# Patient Record
Sex: Male | Born: 1985 | Race: White | Hispanic: No | Marital: Single | Smoking: Current every day smoker
Health system: Southern US, Community
[De-identification: ages and names within clinical notes are randomized; demographics above are authoritative.]

## PROBLEM LIST (undated history)

## (undated) DIAGNOSIS — F988 Other specified behavioral and emotional disorders with onset usually occurring in childhood and adolescence: Secondary | ICD-10-CM

## (undated) DIAGNOSIS — J45909 Unspecified asthma, uncomplicated: Secondary | ICD-10-CM

## (undated) DIAGNOSIS — M419 Scoliosis, unspecified: Secondary | ICD-10-CM

## (undated) HISTORY — PX: MOLE REMOVAL: SHX2046

---

## 2005-06-19 ENCOUNTER — Emergency Department (HOSPITAL_COMMUNITY): Admission: EM | Admit: 2005-06-19 | Discharge: 2005-06-19 | Payer: Self-pay | Admitting: Emergency Medicine

## 2007-11-20 ENCOUNTER — Ambulatory Visit (HOSPITAL_COMMUNITY): Admission: RE | Admit: 2007-11-20 | Discharge: 2007-11-20 | Payer: Self-pay | Admitting: Family Medicine

## 2011-05-23 ENCOUNTER — Encounter: Payer: Self-pay | Admitting: *Deleted

## 2011-05-23 ENCOUNTER — Emergency Department (HOSPITAL_COMMUNITY): Payer: No Typology Code available for payment source

## 2011-05-23 ENCOUNTER — Emergency Department (HOSPITAL_COMMUNITY)
Admission: EM | Admit: 2011-05-23 | Discharge: 2011-05-23 | Disposition: A | Discharge status: Home / Self Care | Payer: No Typology Code available for payment source | Attending: Emergency Medicine | Admitting: Emergency Medicine

## 2011-05-23 DIAGNOSIS — F17200 Nicotine dependence, unspecified, uncomplicated: Secondary | ICD-10-CM | POA: Insufficient documentation

## 2011-05-23 DIAGNOSIS — M542 Cervicalgia: Secondary | ICD-10-CM | POA: Insufficient documentation

## 2011-05-23 DIAGNOSIS — S161XXA Strain of muscle, fascia and tendon at neck level, initial encounter: Secondary | ICD-10-CM

## 2011-05-23 DIAGNOSIS — S139XXA Sprain of joints and ligaments of unspecified parts of neck, initial encounter: Secondary | ICD-10-CM | POA: Insufficient documentation

## 2011-05-23 MED ORDER — CYCLOBENZAPRINE HCL 10 MG PO TABS: ORAL_TABLET | ORAL | Status: DC

## 2011-05-23 MED ORDER — IBUPROFEN 800 MG PO TABS
800.0000 mg | ORAL_TABLET | Freq: Once | ORAL | Status: AC
  Administered 2011-05-23: 800 mg via ORAL
  Filled 2011-05-23: qty 1
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
## 2011-05-24 NOTE — ED Provider Notes (Signed)
Medical screening examination/treatment/procedure(s) were performed by non-physician practitioner and as supervising physician I was immediately available for consultation/collaboration. Ichiro Chesnut, MD, FACEP   Casmere Hollenbeck L Channa Hazelett, MD 05/24/11 0107 

## 2011-12-27 DIAGNOSIS — Z79899 Other long term (current) drug therapy: Secondary | ICD-10-CM | POA: Diagnosis not present

## 2011-12-27 DIAGNOSIS — J45909 Unspecified asthma, uncomplicated: Secondary | ICD-10-CM | POA: Diagnosis not present

## 2011-12-27 DIAGNOSIS — B354 Tinea corporis: Secondary | ICD-10-CM | POA: Diagnosis not present

## 2012-01-31 DIAGNOSIS — Z6828 Body mass index (BMI) 28.0-28.9, adult: Secondary | ICD-10-CM | POA: Diagnosis not present

## 2012-01-31 DIAGNOSIS — S93409A Sprain of unspecified ligament of unspecified ankle, initial encounter: Secondary | ICD-10-CM | POA: Diagnosis not present

## 2012-01-31 DIAGNOSIS — M25579 Pain in unspecified ankle and joints of unspecified foot: Secondary | ICD-10-CM | POA: Diagnosis not present

## 2012-03-05 DIAGNOSIS — Z6828 Body mass index (BMI) 28.0-28.9, adult: Secondary | ICD-10-CM | POA: Diagnosis not present

## 2012-03-05 DIAGNOSIS — Z7182 Exercise counseling: Secondary | ICD-10-CM | POA: Diagnosis not present

## 2012-03-05 DIAGNOSIS — Z713 Dietary counseling and surveillance: Secondary | ICD-10-CM | POA: Diagnosis not present

## 2012-03-05 DIAGNOSIS — K219 Gastro-esophageal reflux disease without esophagitis: Secondary | ICD-10-CM | POA: Diagnosis not present

## 2012-11-07 DIAGNOSIS — J45909 Unspecified asthma, uncomplicated: Secondary | ICD-10-CM | POA: Diagnosis not present

## 2012-11-07 DIAGNOSIS — Z79899 Other long term (current) drug therapy: Secondary | ICD-10-CM | POA: Diagnosis not present

## 2012-11-07 DIAGNOSIS — J984 Other disorders of lung: Secondary | ICD-10-CM | POA: Diagnosis not present

## 2012-11-07 DIAGNOSIS — Z6827 Body mass index (BMI) 27.0-27.9, adult: Secondary | ICD-10-CM | POA: Diagnosis not present

## 2012-11-07 DIAGNOSIS — Z0000 Encounter for general adult medical examination without abnormal findings: Secondary | ICD-10-CM | POA: Diagnosis not present

## 2012-11-28 DIAGNOSIS — F988 Other specified behavioral and emotional disorders with onset usually occurring in childhood and adolescence: Secondary | ICD-10-CM | POA: Diagnosis not present

## 2012-12-31 DIAGNOSIS — F988 Other specified behavioral and emotional disorders with onset usually occurring in childhood and adolescence: Secondary | ICD-10-CM | POA: Diagnosis not present

## 2013-01-08 DIAGNOSIS — F988 Other specified behavioral and emotional disorders with onset usually occurring in childhood and adolescence: Secondary | ICD-10-CM | POA: Diagnosis not present

## 2013-01-27 DIAGNOSIS — J45909 Unspecified asthma, uncomplicated: Secondary | ICD-10-CM | POA: Diagnosis not present

## 2013-01-27 DIAGNOSIS — IMO0002 Reserved for concepts with insufficient information to code with codable children: Secondary | ICD-10-CM | POA: Diagnosis not present

## 2013-01-27 DIAGNOSIS — Z6827 Body mass index (BMI) 27.0-27.9, adult: Secondary | ICD-10-CM | POA: Diagnosis not present

## 2013-02-04 DIAGNOSIS — F988 Other specified behavioral and emotional disorders with onset usually occurring in childhood and adolescence: Secondary | ICD-10-CM | POA: Diagnosis not present

## 2013-04-24 DIAGNOSIS — F988 Other specified behavioral and emotional disorders with onset usually occurring in childhood and adolescence: Secondary | ICD-10-CM | POA: Diagnosis not present

## 2013-05-20 DIAGNOSIS — Z681 Body mass index (BMI) 19 or less, adult: Secondary | ICD-10-CM | POA: Diagnosis not present

## 2013-05-20 DIAGNOSIS — L0291 Cutaneous abscess, unspecified: Secondary | ICD-10-CM | POA: Diagnosis not present

## 2013-09-24 DIAGNOSIS — J209 Acute bronchitis, unspecified: Secondary | ICD-10-CM | POA: Diagnosis not present

## 2013-09-24 DIAGNOSIS — Z6828 Body mass index (BMI) 28.0-28.9, adult: Secondary | ICD-10-CM | POA: Diagnosis not present

## 2013-10-13 DIAGNOSIS — Z681 Body mass index (BMI) 19 or less, adult: Secondary | ICD-10-CM | POA: Diagnosis not present

## 2013-10-13 DIAGNOSIS — L0291 Cutaneous abscess, unspecified: Secondary | ICD-10-CM | POA: Diagnosis not present

## 2013-10-13 DIAGNOSIS — L0390 Cellulitis, unspecified: Secondary | ICD-10-CM | POA: Diagnosis not present

## 2013-10-23 DIAGNOSIS — F988 Other specified behavioral and emotional disorders with onset usually occurring in childhood and adolescence: Secondary | ICD-10-CM | POA: Diagnosis not present

## 2014-01-22 DIAGNOSIS — F988 Other specified behavioral and emotional disorders with onset usually occurring in childhood and adolescence: Secondary | ICD-10-CM | POA: Diagnosis not present

## 2014-04-16 DIAGNOSIS — F988 Other specified behavioral and emotional disorders with onset usually occurring in childhood and adolescence: Secondary | ICD-10-CM | POA: Diagnosis not present

## 2014-07-09 DIAGNOSIS — F909 Attention-deficit hyperactivity disorder, unspecified type: Secondary | ICD-10-CM | POA: Diagnosis not present

## 2014-09-28 DIAGNOSIS — F900 Attention-deficit hyperactivity disorder, predominantly inattentive type: Secondary | ICD-10-CM | POA: Diagnosis not present

## 2015-01-27 DIAGNOSIS — F900 Attention-deficit hyperactivity disorder, predominantly inattentive type: Secondary | ICD-10-CM | POA: Diagnosis not present

## 2015-02-01 DIAGNOSIS — F900 Attention-deficit hyperactivity disorder, predominantly inattentive type: Secondary | ICD-10-CM | POA: Diagnosis not present

## 2015-03-22 DIAGNOSIS — D485 Neoplasm of uncertain behavior of skin: Secondary | ICD-10-CM | POA: Diagnosis not present

## 2015-03-22 DIAGNOSIS — Z1389 Encounter for screening for other disorder: Secondary | ICD-10-CM | POA: Diagnosis not present

## 2015-03-22 DIAGNOSIS — E663 Overweight: Secondary | ICD-10-CM | POA: Diagnosis not present

## 2015-04-28 DIAGNOSIS — Z719 Counseling, unspecified: Secondary | ICD-10-CM | POA: Diagnosis not present

## 2015-04-28 DIAGNOSIS — Z6829 Body mass index (BMI) 29.0-29.9, adult: Secondary | ICD-10-CM | POA: Diagnosis not present

## 2015-04-28 DIAGNOSIS — F70 Mild intellectual disabilities: Secondary | ICD-10-CM | POA: Diagnosis not present

## 2015-04-28 DIAGNOSIS — Z1389 Encounter for screening for other disorder: Secondary | ICD-10-CM | POA: Diagnosis not present

## 2015-04-28 DIAGNOSIS — E663 Overweight: Secondary | ICD-10-CM | POA: Diagnosis not present

## 2015-04-28 DIAGNOSIS — Z0001 Encounter for general adult medical examination with abnormal findings: Secondary | ICD-10-CM | POA: Diagnosis not present

## 2015-04-28 DIAGNOSIS — F909 Attention-deficit hyperactivity disorder, unspecified type: Secondary | ICD-10-CM | POA: Diagnosis not present

## 2015-05-11 DIAGNOSIS — L812 Freckles: Secondary | ICD-10-CM | POA: Diagnosis not present

## 2015-05-11 DIAGNOSIS — L218 Other seborrheic dermatitis: Secondary | ICD-10-CM | POA: Diagnosis not present

## 2015-05-11 DIAGNOSIS — C44310 Basal cell carcinoma of skin of unspecified parts of face: Secondary | ICD-10-CM | POA: Diagnosis not present

## 2015-05-11 DIAGNOSIS — D1801 Hemangioma of skin and subcutaneous tissue: Secondary | ICD-10-CM | POA: Diagnosis not present

## 2015-06-22 DIAGNOSIS — C44319 Basal cell carcinoma of skin of other parts of face: Secondary | ICD-10-CM | POA: Diagnosis not present

## 2015-06-28 DIAGNOSIS — Z0001 Encounter for general adult medical examination with abnormal findings: Secondary | ICD-10-CM | POA: Diagnosis not present

## 2015-06-28 DIAGNOSIS — Z1389 Encounter for screening for other disorder: Secondary | ICD-10-CM | POA: Diagnosis not present

## 2015-06-28 DIAGNOSIS — F70 Mild intellectual disabilities: Secondary | ICD-10-CM | POA: Diagnosis not present

## 2015-06-28 DIAGNOSIS — E785 Hyperlipidemia, unspecified: Secondary | ICD-10-CM | POA: Diagnosis not present

## 2015-08-27 ENCOUNTER — Encounter (HOSPITAL_COMMUNITY): Payer: Medicare Other | Attending: Hematology & Oncology | Admitting: Hematology & Oncology

## 2015-08-27 ENCOUNTER — Encounter (HOSPITAL_COMMUNITY): Payer: Self-pay | Admitting: Hematology & Oncology

## 2015-08-27 VITALS — BP 166/91 | HR 54 | Temp 97.7°F | Resp 18 | Ht 68.0 in | Wt 195.7 lb

## 2015-08-27 DIAGNOSIS — Z720 Tobacco use: Secondary | ICD-10-CM | POA: Diagnosis not present

## 2015-08-27 DIAGNOSIS — J4520 Mild intermittent asthma, uncomplicated: Secondary | ICD-10-CM

## 2015-08-27 DIAGNOSIS — D72829 Elevated white blood cell count, unspecified: Secondary | ICD-10-CM | POA: Diagnosis not present

## 2015-08-27 DIAGNOSIS — J45909 Unspecified asthma, uncomplicated: Secondary | ICD-10-CM | POA: Insufficient documentation

## 2015-08-27 DIAGNOSIS — D729 Disorder of white blood cells, unspecified: Secondary | ICD-10-CM | POA: Diagnosis not present

## 2015-08-27 LAB — COMPREHENSIVE METABOLIC PANEL
ALT: 46 U/L (ref 17–63)
AST: 29 U/L (ref 15–41)
Albumin: 4.4 g/dL (ref 3.5–5.0)
Alkaline Phosphatase: 59 U/L (ref 38–126)
Anion gap: 9 (ref 5–15)
BUN: 12 mg/dL (ref 6–20)
CO2: 26 mmol/L (ref 22–32)
Calcium: 8.6 mg/dL — ABNORMAL LOW (ref 8.9–10.3)
Chloride: 105 mmol/L (ref 101–111)
Creatinine, Ser: 0.85 mg/dL (ref 0.61–1.24)
GFR calc Af Amer: >60 mL/min (ref >60)
GFR calc non Af Amer: >60 mL/min (ref >60)
GLUCOSE: 102 mg/dL — AB (ref 65–99)
POTASSIUM: 4.2 mmol/L (ref 3.5–5.1)
SODIUM: 140 mmol/L (ref 135–145)
Total Bilirubin: 0.5 mg/dL (ref 0.3–1.2)
Total Protein: 7.4 g/dL (ref 6.5–8.1)

## 2015-08-27 LAB — TSH: TSH: 1.982 u[IU]/mL (ref 0.350–4.500)

## 2015-08-27 LAB — CBC WITH DIFFERENTIAL/PLATELET
Basophils Absolute: 0.1 10*3/uL (ref 0.0–0.1)
Basophils Relative: 0 %
Eosinophils Absolute: 0.2 10*3/uL (ref 0.0–0.7)
Eosinophils Relative: 2 %
HCT: 46.1 % (ref 39.0–52.0)
Hemoglobin: 15.3 g/dL (ref 13.0–17.0)
Lymphocytes Relative: 31 %
Lymphs Abs: 4.2 10*3/uL — ABNORMAL HIGH (ref 0.7–4.0)
MCH: 28.6 pg (ref 26.0–34.0)
MCHC: 33.2 g/dL (ref 30.0–36.0)
MCV: 86.2 fL (ref 78.0–100.0)
Monocytes Absolute: 1.3 10*3/uL — ABNORMAL HIGH (ref 0.1–1.0)
Monocytes Relative: 10 %
NEUTROS ABS: 7.9 10*3/uL — AB (ref 1.7–7.7)
Neutrophils Relative %: 57 %
Platelets: 271 10*3/uL (ref 150–400)
RBC: 5.35 MIL/uL (ref 4.22–5.81)
RDW: 14.1 % (ref 11.5–15.5)
WBC: 13.6 10*3/uL — ABNORMAL HIGH (ref 4.0–10.5)

## 2015-08-27 LAB — C-REACTIVE PROTEIN: CRP: 0.6 mg/dL (ref <1.0)

## 2015-08-27 LAB — SEDIMENTATION RATE: Sed Rate: 3 mm/hr (ref 0–16)

## 2015-08-27 NOTE — Progress Notes (Signed)
Bruce Nichols's reason for visit today is for labs as scheduled per MD orders.  Venipuncture performed with a 23 gauge butterfly needle to L Antecubital.  Bruce Nichols tolerated procedure well and without incident; questions were answered and patient was discharged.

## 2015-08-27 NOTE — Progress Notes (Signed)
[place] at [place] NOTE  No care team member to display  CHIEF COMPLAINTS/PURPOSE OF CONSULTATION:  Leukocytosis  HISTORY OF PRESENTING ILLNESS: Bruce Nichols 30 y.o. male is here because of leukocytosis. He is here today with his mother. They've been going to [facility] for a while now.  Bruce Nichols says he feels pretty good most of the time, and that his appetite is well; he says he eats well. He says he doesn't sleep all that well, "but when he sleeps, he sleeps."  His mother states that he does have a tendency to get bronchitis. She says that he has an inhaler for chronic asthma, but she thinks that the bronchitis is more noteworthy. She says that she thinks that the asthma started in his teens. She notes that he smokes and it certainly does not help.   Bruce Nichols mother says that she doesn't know if he has eczema, but that he definitely has really dry skin, and his scalp especially is very dry. His mother says that she herself struggles from eczema.  He denies seasonal allergies.   He denies fevers, chills, recurrent infections, weight loss, change in bowel or bladder habits.   MEDICAL HISTORY:  History reviewed. No pertinent past medical history.  SURGICAL HISTORY: Past Surgical History  Procedure Laterality Date  . Mole removal      right side of face    SOCIAL HISTORY: Social History   Social History  . Marital Status: Single    Spouse Name: N/A  . Number of Children: N/A  . Years of Education: N/A   Occupational History  . Not on file.   Social History Main Topics  . Smoking status: Current Every Day Smoker -- 2.00 packs/day  . Smokeless tobacco: Not on file  . Alcohol Use: Yes     Comment: occasionally  . Drug Use: No  . Sexual Activity: Not on file   Other Topics Concern  . 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  He lives at home with his mom; his parents are divorced. He smokes; his mother says "he's really not bad;" she says a pack will last him 3-4 days She says he was in his late 20's before he started smoking, and she feels he only started because of social reasons. He says he occasionally drinks alcohol; his mother says about once a month He says he can't lie about it, he occasionally uses marijuana He is ADD and takes generic adderol  FAMILY HISTORY: History reviewed. No pertinent family history. has no family status information on file.    His mother is 25, but will be 59 on the 21st.  She has high blood pressure, but that's her only health concern she reports They are both heavier His father is around 3 and has had heart surgery, and his lungs have collapsed on him before His father is a chain smoker  He has one half sister; she is 69 now he thinks  ALLERGIES:  is allergic to bee venom and penicillins.  MEDICATIONS:  Current Outpatient Prescriptions  Medication Sig Dispense Refill  . amphetamine-dextroamphetamine (ADDERALL) 20 MG tablet Take 20 mg by mouth daily.     No current facility-administered medications for this visit.    Review of Systems  Constitutional: Negative.   HENT: Negative.   Eyes:  Negative.   Respiratory: Negative.   Cardiovascular: Negative.   Gastrointestinal: Negative.   Genitourinary: Negative.   Musculoskeletal: Negative.   Skin: Negative.   Neurological: Negative.   Endo/Heme/Allergies: Negative.   Psychiatric/Behavioral: Negative.   All other systems reviewed and are negative.  14 point ROS was done and is otherwise as detailed above or in HPI   PHYSICAL EXAMINATION: ECOG PERFORMANCE STATUS: 0 - Asymptomatic  Filed Vitals:   08/27/15 1503  BP: 166/91  Pulse: 54  Temp: 97.7 F (36.5 C)  Resp: 18   Filed Weights   08/27/15 1503  Weight: 195 lb 11.2 oz (88.769 kg)   Physical Exam  Constitutional: He is  oriented to person, place, and time and well-developed, well-nourished, and in no distress.  Overweight.  HENT:  Head: Normocephalic and atraumatic.  Nose: Nose normal.  Mouth/Throat: Oropharynx is clear and moist. No oropharyngeal exudate.  Eyes: Conjunctivae and EOM are normal. Pupils are equal, round, and reactive to light. Right eye exhibits no discharge. Left eye exhibits no discharge. No scleral icterus.  Neck: Normal range of motion. Neck supple. No tracheal deviation present. No thyromegaly present.  Cardiovascular: Normal rate, regular rhythm and normal heart sounds.  Exam reveals no gallop and no friction rub.   No murmur heard. Pulmonary/Chest: Effort normal and breath sounds normal. He has no wheezes. He has no rales.  Abdominal: Soft. Bowel sounds are normal. He exhibits no distension and no mass. There is no tenderness. There is no rebound and no guarding.  Musculoskeletal: Normal range of motion. He exhibits no edema.  Lymphadenopathy:    He has no cervical adenopathy.  Neurological: He is alert and oriented to person, place, and time. He has normal reflexes. No cranial nerve deficit. Gait normal. Coordination normal.  Skin: Skin is warm and dry. No rash noted.  Psychiatric: Mood, memory, affect and judgment normal.  Nursing note and vitals reviewed.   LABORATORY DATA:  I have reviewed the data as listed Laboratory studies from Dr. Nolon Rod office show a WBC count of 10.9K on 11/07/2012 with a neutrophil predominance. WBC count of 14K on 06/27/2013 on 06/28/2015 with an elevation of neutrophils, lymphocytes and monocytes.   ASSESSMENT & PLAN:  Leukocytosis Tobacco Use   I reviewed the potential causes of leukocytosis (specifically mild neutrophilia) including but not limited to:  ?Any active inflammatory condition or infection ?Cigarette smoking, which may be the most common cause of mild neutrophilia ?Previously diagnosed hematologic disease (such as acute and chronic  leukemias, chronic myeloproliferative or myelodysplastic disease) ?The presence of, and treatment for, a chronic anxiety state, panic disorder, rage, or emotional stress (eg, posttraumatic stress disorder, depression) ?Presence of non-hematologic diseases known to increase neutrophil counts (eg, eclampsia, thyroid storm, hypercortisolism). Medications - Various medications may cause neutrophilia. However,  such cases are rare and appear in the literature as isolated case reports.  Plan today is to proceed with a CBC with peripheral smear review, BCR-ABL to r/o CML, CRP and ESR to look for occult inflammatory disease and TSH. MPD evaluation will not be pursued today however will certainly be an option moving forward if necessary.  We will see him back in 3 weeks for follow-up on his labs, to assess what's causing his leukocytosis. We will discuss smoking cessation further at that time.  Orders Placed This Encounter  Procedures  . CBC with Differential  . Pathologist smear review  . Comprehensive metabolic panel  . BCR-ABL1, CML/ALL, PCR, QUANT  . Sedimentation rate  .  C-reactive protein  . TSH    All questions were answered. The patient knows to call the clinic with any problems, questions or concerns.  This document serves as a record of services personally performed by Ancil Linsey, MD. It was created on her behalf by Toni Amend, a trained medical scribe. The creation of this record is based on the scribe's personal observations and the provider's statements to them. This document has been checked and approved by the attending provider.  I have reviewed the above documentation for accuracy and completeness, and I agree with the above.  This note was electronically signed.    Molli Hazard, MD  08/27/2015 3:43 PM

## 2015-08-27 NOTE — Patient Instructions (Addendum)
[place] at [facility] Discharge Instructions  RECOMMENDATIONS MADE BY THE CONSULTANT AND ANY TEST RESULTS WILL BE SENT TO YOUR REFERRING PHYSICIAN.  Return in 3 weeks to discuss the results of your lab work.   We will draw labs today.     Thank you for choosing [place] at [facility] to provide your oncology and hematology care.  To afford each patient quality time with our provider, please arrive at least 15 minutes before your scheduled appointment time.    You need to re-schedule your appointment should you arrive 10 or more minutes late.  We strive to give you quality time with our providers, and arriving late affects you and other patients whose appointments are after yours.  Also, if you no show three or more times for appointments you may be dismissed from the clinic at the providers discretion.     Again, thank you for choosing [facility].  Our hope is that these requests will decrease the amount of time that you wait before being seen by our physicians.       _____________________________________________________________  Should you have questions after your visit to [facility], please contact our office at [phone] between the hours of 8:30 a.m. and 4:30 p.m.  Voicemails left after 4:30 p.m. will not be returned until the following business day.  For prescription refill requests, have your pharmacy contact our office.

## 2015-08-30 LAB — PATHOLOGIST SMEAR REVIEW

## 2015-09-03 LAB — BCR-ABL1, CML/ALL, PCR, QUANT

## 2015-09-16 ENCOUNTER — Encounter (HOSPITAL_COMMUNITY): Payer: Self-pay | Admitting: Hematology & Oncology

## 2015-09-16 ENCOUNTER — Encounter (HOSPITAL_BASED_OUTPATIENT_CLINIC_OR_DEPARTMENT_OTHER): Payer: Medicare Other | Admitting: Hematology & Oncology

## 2015-09-16 ENCOUNTER — Ambulatory Visit (HOSPITAL_COMMUNITY): Payer: Self-pay | Admitting: Hematology & Oncology

## 2015-09-16 VITALS — BP 130/80 | HR 46 | Temp 97.6°F | Resp 16 | Wt 196.4 lb

## 2015-09-16 DIAGNOSIS — Z720 Tobacco use: Secondary | ICD-10-CM

## 2015-09-16 DIAGNOSIS — D72829 Elevated white blood cell count, unspecified: Secondary | ICD-10-CM | POA: Diagnosis not present

## 2015-09-16 NOTE — Progress Notes (Deleted)
[name] at [place] NOTE  No care team member to display  CHIEF COMPLAINTS/PURPOSE OF CONSULTATION:  Leukocytosis  HISTORY OF PRESENTING ILLNESS:   ***  Bruce Nichols 30 y.o. male is here because of leukocytosis. He is here today with his mother. They've been going to [facility] for a while now.  [name] says he feels pretty good most of the time, and that his appetite is well; he says he eats well. He says he doesn't sleep all that well, "but when he sleeps, he sleeps."  His mother states that he does have a tendency to get bronchitis. She says that he has an inhaler for chronic asthma, but she thinks that the bronchitis is more noteworthy. She says that she thinks that the asthma started in his teens.  [name] mother says that she doesn't know if he has eczema, but that he definitely has really dry skin, and his scalp especially is very dry. His mother says that she herself struggles from eczema.  His mother doesn't know of any allergies that he has; she says she hasn't seen anything stir up other than the bronchitis.  During the physical exam, he denies any belly pain and denies any pain when his abdomen is palpated.    No history exists.     MEDICAL HISTORY:  History reviewed. No pertinent past medical history.  SURGICAL HISTORY: Past Surgical History  Procedure Laterality Date  . Mole removal      right side of face    SOCIAL HISTORY: Social History   Social History  . Marital Status: Single    Spouse Name: N/A  . Number of Children: N/A  . Years of Education: N/A   Occupational History  . Not on file.   Social History Main Topics  . Smoking status: Current Every Day Smoker -- 2.00 packs/day  . Smokeless tobacco: Not on file  . Alcohol Use: Yes     Comment: occasionally  . Drug Use: No  . Sexual Activity: Not on file   Other Topics Concern  . 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  He lives at home with his mom; his parents are divorced. He smokes; his mother says "he's really not bad;" she says a pack will last him 3-4 days She says he was in his late 20's before he started smoking, and she feels he only started because of social reasons. He says he occasionally drinks alcohol; his mother says about once a month He says he can't lie about it, he occasionally uses marijuana He is ADD and takes generic adderol  FAMILY HISTORY: History reviewed. No pertinent family history. has no family status information on file.    His mother is 38, but will be 35 on the 21st.  She has high blood pressure, but that's her only health concern she reports They are both heavier His father is around 59 and has had heart surgery, and his lungs have collapsed on him before His father is a chain smoker  He has one half sister; she is 60 now he thinks  ALLERGIES:  is allergic to bee venom and penicillins.  MEDICATIONS:  Current Outpatient Prescriptions  Medication Sig Dispense Refill  . albuterol (PROVENTIL HFA;VENTOLIN HFA) 108 (90 Base) MCG/ACT inhaler Inhale 1 puff into the lungs every 6 (  six) hours as needed for wheezing or shortness of breath.    . amphetamine-dextroamphetamine (ADDERALL) 20 MG tablet Take 20 mg by mouth daily.     No current facility-administered medications for this visit.    Review of Systems  Constitutional: Negative.   HENT: Negative.   Eyes: Negative.   Respiratory: Negative.   Cardiovascular: Negative.   Gastrointestinal: Negative.   Genitourinary: Negative.   Musculoskeletal: Negative.   Skin: Negative.   Neurological: Negative.   Endo/Heme/Allergies: Negative.   Psychiatric/Behavioral: Negative.   All other systems reviewed and are negative.  14 point ROS was done and is otherwise as detailed above or in HPI  ***  PHYSICAL  EXAMINATION: ECOG PERFORMANCE STATUS: {CHL ONC ECOG [id number]  Filed Vitals:   09/16/15 1035  BP: 130/80  Pulse: 46  Temp: 97.6 F (36.4 C)  Resp: 16   Filed Weights   09/16/15 1035  Weight: 196 lb 6.4 oz (89.086 kg)     Physical Exam  Constitutional: He is oriented to person, place, and time and well-developed, well-nourished, and in no distress.  Overweight.  HENT:  Head: Normocephalic and atraumatic.  Mouth/Throat: Oropharynx is clear and moist.  Eyes: Conjunctivae and EOM are normal. Pupils are equal, round, and reactive to light.  Neck: Normal range of motion. Neck supple.  Cardiovascular: Normal rate, regular rhythm and normal heart sounds.   Pulmonary/Chest: Effort normal and breath sounds normal.  Abdominal: Soft.  Musculoskeletal: Normal range of motion.  Neurological: He is alert and oriented to person, place, and time. Gait normal.  Skin: Skin is warm and dry.  Psychiatric: Mood, memory, affect and judgment normal.  Nursing note and vitals reviewed.     LABORATORY DATA:  I have reviewed the data as listed Lab Results  Component Value Date   WBC 13.6* 08/27/2015   HGB 15.3 08/27/2015   HCT 46.1 08/27/2015   MCV 86.2 08/27/2015   PLT 271 08/27/2015   CMP     Component Value Date/Time   NA 140 08/27/2015 1610   K 4.2 08/27/2015 1610   CL 105 08/27/2015 1610   CO2 26 08/27/2015 1610   GLUCOSE 102* 08/27/2015 1610   BUN 12 08/27/2015 1610   CREATININE 0.85 08/27/2015 1610   CALCIUM 8.6* 08/27/2015 1610   PROT 7.4 08/27/2015 1610   ALBUMIN 4.4 08/27/2015 1610   AST 29 08/27/2015 1610   ALT 46 08/27/2015 1610   ALKPHOS 59 08/27/2015 1610   BILITOT 0.5 08/27/2015 1610   GFRNONAA >60 08/27/2015 1610   GFRAA >60 08/27/2015 1610     RADIOGRAPHIC STUDIES: I have personally reviewed the radiological images as listed and agreed with the findings in the report. No results found.  ASSESSMENT & PLAN:   ***  We will see him back in 3 weeks  for follow-up on his labs, to assess what's causing his leukocytosis. We will discuss smoking cessation further at that time.  No problem-specific assessment & plan notes found for this encounter.  No orders of the defined types were placed in this encounter.    All questions were answered. The patient knows to call the clinic with any problems, questions or concerns.  I spent {CHL ONC TIME VISIT - [id number] counseling the patient face to face. The total time spent in the appointment was {CHL ONC TIME VISIT - [id number] and more than 50% was on counseling.  This document serves as a record of services personally performed by Ancil Linsey, MD. It was created  on her behalf by Toni Amend, a trained medical scribe. The creation of this record is based on the scribe's personal observations and the provider's statements to them. This document has been checked and approved by the attending provider.  I have reviewed the above documentation for accuracy and completeness, and I agree with the above.  This note was electronically signed.    Molli Hazard, MD  09/16/2015 11:08 AM

## 2015-09-16 NOTE — Patient Instructions (Signed)
[place] at [facility] Discharge Instructions  RECOMMENDATIONS MADE BY THE CONSULTANT AND ANY TEST RESULTS WILL BE SENT TO YOUR REFERRING PHYSICIAN.    Exam and discussion completed by Dr Whitney Muse today On your blood work, it showed nothing serious, but we are going to monitor your blood work. Work on quitting smoking. Return to see the doctor in 3 months with lab work Please call the clinic if you have any questions or concerns    Thank you for choosing [place] at [facility] to provide your oncology and hematology care.  To afford each patient quality time with our provider, please arrive at least 15 minutes before your scheduled appointment time.   Beginning January 23rd 2017 lab work for the [facility] will be done in the  Main lab at [facility] on 1st floor. If you have a lab appointment with the [facility] please come in thru the  Main Entrance and check in at the main information desk  You need to re-schedule your appointment should you arrive 10 or more minutes late.  We strive to give you quality time with our providers, and arriving late affects you and other patients whose appointments are after yours.  Also, if you no show three or more times for appointments you may be dismissed from the clinic at the providers discretion.     Again, thank you for choosing [facility].  Our hope is that these requests will decrease the amount of time that you wait before being seen by our physicians.       _____________________________________________________________  Should you have questions after your visit to [facility], please contact our office at [phone] between the hours of 8:30 a.m. and 4:30 p.m.  Voicemails left after 4:30 p.m. will not be returned until the following business day.  For prescription refill requests, have your pharmacy contact our office.

## 2015-10-09 NOTE — Progress Notes (Signed)
[place] [facility] [place] NOTE  No care team member to display  CHIEF COMPLAINTS/PURPOSE OF CONSULTATION:  Leukocytosis  HISTORY OF PRESENTING ILLNESS: Bruce Nichols 30 y.o. male is here because of leukocytosis. He is here today with his mother. They've been going to [facility] for a while now.  He is here to follow up on labs obtained at his last visit. He notes no changes. He continues to smoke.   He denies fevers, chills, recurrent infections, weight loss, change in bowel or bladder habits.   MEDICAL HISTORY:  History reviewed. No pertinent past medical history.  SURGICAL HISTORY: Past Surgical History  Procedure Laterality Date  . Mole removal      right side of face    SOCIAL HISTORY: Social History   Social History  . Marital Status: Single    Spouse Name: N/A  . Number of Children: N/A  . Years of Education: N/A   Occupational History  . Not on file.   Social History Main Topics  . Smoking status: Current Every Day Smoker -- 2.00 packs/day  . Smokeless tobacco: Not on file  . Alcohol Use: Yes     Comment: occasionally  . Drug Use: No  . Sexual Activity: Not on file   Other Topics Concern  . 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  He lives at home with his mom; his parents are divorced. He smokes; his mother says "he's really not bad;" she says a pack will last him 3-4 days She says he was in his late 20's before he started smoking, and she feels he only started because of social reasons. He says he occasionally drinks alcohol; his mother says about once a month He says he can't lie about it, he occasionally uses marijuana He is ADD and takes generic adderol  FAMILY HISTORY: History reviewed. No pertinent family history. has no family status information on file.    His mother is 32, but will be 1 on  the 21st.  She has high blood pressure, but that's her only health concern she reports They are both heavier His father is around 23 and has had heart surgery, and his lungs have collapsed on him before His father is a chain smoker  He has one half sister; she is 25 now he thinks  ALLERGIES:  is allergic to bee venom and penicillins.  MEDICATIONS:  Current Outpatient Prescriptions  Medication Sig Dispense Refill  . albuterol (PROVENTIL HFA;VENTOLIN HFA) 108 (90 Base) MCG/ACT inhaler Inhale 1 puff into the lungs every 6 (six) hours as needed for wheezing or shortness of breath.    . amphetamine-dextroamphetamine (ADDERALL) 20 MG tablet Take 20 mg by mouth daily.     No current facility-administered medications for this visit.    Review of Systems  Constitutional: Negative.   HENT: Negative.   Eyes: Negative.   Respiratory: Negative.   Cardiovascular: Negative.   Gastrointestinal: Negative.   Genitourinary: Negative.   Musculoskeletal: Negative.   Skin: Negative.   Neurological: Negative.   Endo/Heme/Allergies: Negative.   Psychiatric/Behavioral: Negative.   All other systems reviewed and are negative.  14 point ROS was done and is otherwise as detailed above or in HPI   PHYSICAL EXAMINATION: ECOG PERFORMANCE STATUS: 0 - Asymptomatic  Filed Vitals:   09/16/15 1035  BP: 130/80  Pulse: 46  Temp: 97.6 F (36.4 C)  Resp: 16   Filed Weights   09/16/15 1035  Weight: 196 lb 6.4 oz (89.086 kg)   Physical Exam  Constitutional: He is oriented to person, place, and time and well-developed, well-nourished, and in no distress.  Overweight.  HENT:  Head: Normocephalic and atraumatic.  Nose: Nose normal.  Mouth/Throat: Oropharynx is clear and moist. No oropharyngeal exudate.  Eyes: Conjunctivae and EOM are normal. Pupils are equal, round, and reactive to light. Right eye exhibits no discharge. Left eye exhibits no discharge. No scleral icterus.  Neck: Normal range of  motion. Neck supple. No tracheal deviation present. No thyromegaly present.  Cardiovascular: Normal rate, regular rhythm and normal heart sounds.  Exam reveals no gallop and no friction rub.   No murmur heard. Pulmonary/Chest: Effort normal and breath sounds normal. He has no wheezes. He has no rales.  Abdominal: Soft. Bowel sounds are normal. He exhibits no distension and no mass. There is no tenderness. There is no rebound and no guarding.  Musculoskeletal: Normal range of motion. He exhibits no edema.  Lymphadenopathy:    He has no cervical adenopathy.  Neurological: He is alert and oriented to person, place, and time. He has normal reflexes. No cranial nerve deficit. Gait normal. Coordination normal.  Skin: Skin is warm and dry. No rash noted.  Psychiatric: Mood, memory, affect and judgment normal.  Nursing note and vitals reviewed.   LABORATORY DATA:  I have reviewed the data as listed Laboratory studies from Dr. Nolon Rod office show a WBC count of 10.9K on 11/07/2012 with a neutrophil predominance. WBC count of 14K on 06/27/2013 on 06/28/2015 with an elevation of neutrophils, lymphocytes and monocytes. Results for QUAMERE, MUSSELL (MRN [id number])   Ref. Range 08/27/2015 16:10  Sodium Latest Ref Range: 135-145 mmol/L 140  Potassium Latest Ref Range: 3.5-5.1 mmol/L 4.2  Chloride Latest Ref Range: 101-111 mmol/L 105  CO2 Latest Ref Range: 22-32 mmol/L 26  BUN Latest Ref Range: 6-20 mg/dL 12  Creatinine Latest Ref Range: 0.61-1.24 mg/dL 0.85  Calcium Latest Ref Range: 8.9-10.3 mg/dL 8.6 (L)  EGFR (Non-African Amer.) Latest Ref Range: >60 mL/min >60  EGFR (African American) Latest Ref Range: >60 mL/min >60  Glucose Latest Ref Range: 65-99 mg/dL 102 (H)  Anion gap Latest Ref Range: 5-15  9  Alkaline Phosphatase Latest Ref Range: 38-126 U/L 59  Albumin Latest Ref Range: 3.5-5.0 g/dL 4.4  AST Latest Ref Range: 15-41 U/L 29  ALT Latest Ref Range: 17-63 U/L 46  Total Protein Latest Ref Range:  6.5-8.1 g/dL 7.4  Total Bilirubin Latest Ref Range: 0.3-1.2 mg/dL 0.5  CRP Latest Ref Range: <1.0 mg/dL 0.6  WBC Latest Ref Range: 4.0-10.5 K/uL 13.6 (H)  RBC Latest Ref Range: 4.22-5.81 MIL/uL 5.35  Hemoglobin Latest Ref Range: 13.0-17.0 g/dL 15.3  HCT Latest Ref Range: 39.0-52.0 % 46.1  MCV Latest Ref Range: 78.0-100.0 fL 86.2  MCH Latest Ref Range: 26.0-34.0 pg 28.6  MCHC Latest Ref Range: 30.0-36.0 g/dL 33.2  RDW Latest Ref Range: 11.5-15.5 % 14.1  Platelets Latest Ref Range: 150-400 K/uL 271  Neutrophils Latest Units: % 57  Lymphocytes Latest Units: % 31  Monocytes Relative Latest Units: % 10  Eosinophil Latest Units: % 2  Basophil Latest Units: % 0  NEUT# Latest Ref Range: 1.7-7.7 K/uL 7.9 (H)  Lymphocyte # Latest Ref Range: 0.7-4.0 K/uL 4.2 (H)  Monocyte # Latest Ref Range: 0.1-1.0 K/uL 1.3 (H)  Eosinophils Absolute Latest Ref Range:  0.0-0.7 K/uL 0.2  Basophils Absolute Latest Ref Range: 0.0-0.1 K/uL 0.1  Sed Rate Latest Ref Range: 0-16 mm/hr 3  TSH Latest Ref Range: 0.350-4.500 uIU/mL 1.982  b2a2 transcript Latest Units: % Comment  b3a2 transcript Latest Units: % Comment  E1A2 Transcript Latest Units: % Comment  Interpretation (BCRAL): Unknown Comment  Director Review [facility]): Unknown Comment  Background (BCRAL): Unknown Comment  Path Review Unknown Reviewed By Michail Jewels...          ASSESSMENT & PLAN:  Leukocytosis Tobacco Use  At this point I feel his neutrophilia/leukocytosis may be secondary to his ongoing tobacco use.  I have recommended ongoing observation.  He will return in several months with repeat CBC with diff, we will also send flow cytometry at that time.  I addressed the importance of smoking cessation with the patient in detail.  We discussed the health benefits of cessation.  We discussed the health detriments of ongoing tobacco use including but not limited to COPD, heart disease and malignancy. We reviewed the multiple options for cessation and I  offered to refer him to smoking cessation classes. We discussed other alternatives to quit such as chantix, wellbutrin. We will continue to address this moving forward.   Orders Placed This Encounter  Procedures  . CBC with Differential    Standing Status: Future     Number of Occurrences:      Standing Expiration Date: 09/15/2016  . Other/Misc lab test    Standing Status: Future     Number of Occurrences:      Standing Expiration Date: 09/15/2016    Order Specific Question:  Test name / description:    Answer:  flow cytometry    All questions were answered. The patient knows to call the clinic with any problems, questions or concerns.  This note was electronically signed.    Molli Hazard, MD  10/09/2015 4:39 PM

## 2015-12-14 NOTE — Assessment & Plan Note (Addendum)
Leukocytosis with neutrophilia and lymphocytosis secondary to chronic hypoxia from lung disease secondary to tobacco abuse/COPD/asthma.  Negative BCR/ABL, and normal ESR, CRP, and TSH on 08/27/2015.  Labs today: CBC diff, peripheral FLOW cytometry.  I have called Wells Guiles in the lab who confirms that peripheral flow cytometry labs were ascertained today (9030- 1055 hrs).  Smoking cessation and education provided today.  Labs in 6 months: CBC diff  Return in 6 months for follow-up.  If labs today are abnormal, we will certainly recall the patient back to the clinic sooner than 6 months.

## 2015-12-14 NOTE — Progress Notes (Signed)
No primary care provider on file. No primary provider on file.  Leukocytosis - Plan: CBC with Differential  CURRENT THERAPY: Surveillance and completion of peripheral work-up.  INTERVAL HISTORY: Bruce Nichols 30 y.o. male returns for followup of leukocytosis with neutrophilia and lymphocytosis secondary to chronic hypoxia from lung disease secondary to tobacco abuse/COPD/asthma.  Negative BCR/ABL, and normal ESR, CRP, and TSH on 08/27/2015.  I personally reviewed and went over laboratory results with the patient.  The results are noted within this dictation.  His peripheral workup thus far has been unimpressive. Smear review demonstrates a mild leukocytosis.  ESR and CRP are within normal limits. BCR/ABL is negative. TSH is within normal limits as well.  We will update labs today.  He continues to smoke but has significantly decreased. He notes that he only smokes 3-5 cigarettes per day now compared to one pack per day. Smoking cessation dictation is provided. He is encouraged to continue to have back on smoking.  He is educated on the etiology of his leukocytosis which is thought to be secondary to complications of tobacco abuse.    History reviewed. No pertinent past medical history.  has Leukocytosis and Asthma on his problem list.     is allergic to bee venom and penicillins.  Current Outpatient Prescriptions on File Prior to Visit  Medication Sig Dispense Refill  . albuterol (PROVENTIL HFA;VENTOLIN HFA) 108 (90 Base) MCG/ACT inhaler Inhale 1 puff into the lungs every 6 (six) hours as needed for wheezing or shortness of breath.    . amphetamine-dextroamphetamine (ADDERALL) 20 MG tablet Take 20 mg by mouth daily.     No current facility-administered medications on file prior to visit.    Past Surgical History  Procedure Laterality Date  . Mole removal      right side of face    Denies any headaches, dizziness, double vision, fevers, chills, night sweats, nausea,  vomiting, diarrhea, constipation, chest pain, heart palpitations, shortness of breath, blood in stool, black tarry stool, urinary pain, urinary burning, urinary frequency, hematuria.   PHYSICAL EXAMINATION  ECOG PERFORMANCE STATUS: 0 - Asymptomatic  Filed Vitals:   12/15/15 1026  BP: 127/79  Pulse: 81  Temp: 97.6 F (36.4 C)  Resp: 18    GENERAL:alert, no distress, well nourished, well developed, comfortable, cooperative, obese, smiling and unkempt, and accompanied by his mother. SKIN: skin color, texture, turgor are normal, no rashes or significant lesions HEAD: Normocephalic, No masses, lesions, tenderness or abnormalities, pityriasis capitis EYES: normal, EOMI, Conjunctiva are pink and non-injected EARS: External ears normal OROPHARYNX:lips, buccal mucosa, and tongue normal and mucous membranes are moist  NECK: supple, no adenopathy, trachea midline LYMPH:  no palpable lymphadenopathy BREAST:not examined LUNGS: clear to auscultation and percussion, Without wheezes, rales, or rhonchi. HEART: regular rate & rhythm, no murmurs, no gallops, S1 normal and S2 normal ABDOMEN:abdomen soft, non-tender, obese and normal bowel sounds BACK: Back symmetric, no curvature. EXTREMITIES:less then 2 second capillary refill, no joint deformities, effusion, or inflammation, no skin discoloration, no cyanosis  NEURO: alert & oriented x 3 with fluent speech, no focal motor/sensory deficits, gait normal   LABORATORY DATA: CBC    Component Value Date/Time   WBC 14.7* 12/15/2015 0909   RBC 5.51 12/15/2015 0909   HGB 15.6 12/15/2015 0909   HCT 46.5 12/15/2015 0909   PLT 309 12/15/2015 0909   MCV 84.4 12/15/2015 0909   MCH 28.3 12/15/2015 0909   MCHC 33.5 12/15/2015 0909  RDW 13.8 12/15/2015 0909   LYMPHSABS 5.5* 12/15/2015 0909   MONOABS 1.3* 12/15/2015 0909   EOSABS 0.1 12/15/2015 0909   BASOSABS 0.1 12/15/2015 0909      Chemistry      Component Value Date/Time   NA 140 08/27/2015  1610   K 4.2 08/27/2015 1610   CL 105 08/27/2015 1610   CO2 26 08/27/2015 1610   BUN 12 08/27/2015 1610   CREATININE 0.85 08/27/2015 1610      Component Value Date/Time   CALCIUM 8.6* 08/27/2015 1610   ALKPHOS 59 08/27/2015 1610   AST 29 08/27/2015 1610   ALT 46 08/27/2015 1610   BILITOT 0.5 08/27/2015 1610     Lab Results  Component Value Date   ESRSEDRATE 3 08/27/2015   Lab Results  Component Value Date   CRP 0.6 08/27/2015   Lab Results  Component Value Date   TSH 1.982 08/27/2015    Path Review Reviewed By Violet Baldy, M.D.   Comments: 01.09.17  MILD LEUKOCYTOSIS.  Performed at [facility]        b2a2 transcript % Comment   Comments: (NOTE)       <0.001 %  (sensitivity limit of assay)     b3a2 transcript % Comment   Comments: (NOTE)       <0.001 %  (sensitivity limit of assay)     E1A2 Transcript % Comment   Comments: (NOTE)       <0.001 %  (sensitivity limit of assay)     Interpretation (BCRAL):  Comment   Comments: (NOTE)  The quantitative RT-PCR assay is negative for the b2a2 and b3a2  (p210) and e1a2 (p190) fusion gene transcripts found in chronic  myelogenous leukemia and Philadelphia positive acute lymphocytic  leukemia. These results do not rule out the presence of low levels of  BCR-ABL1 transcript below the level of detection of this assay, or  the presence of rare BCR-ABL1 transcripts not detected by this assay.          PENDING LABS:   RADIOGRAPHIC STUDIES:  No results found.   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  Negative BCR/ABL, and normal ESR, CRP, and TSH on 08/27/2015.  Labs today: CBC diff, peripheral FLOW cytometry.  I have called Wells Guiles in the lab who confirms that peripheral flow cytometry labs were ascertained today  (2297- 1055 hrs).  Smoking cessation and education provided today.  Labs in 6 months: CBC diff  Return in 6 months for follow-up.  If labs today are abnormal, we will certainly recall the patient back to the clinic sooner than 6 months.    THERAPY PLAN:  We'll complete the peripheral workup for leukocytosis with flow cytometry today.  All questions were answered. The patient knows to call the clinic with any problems, questions or concerns. We can certainly see the patient much sooner if necessary.  Patient and plan discussed with Dr. Ancil Linsey and she is in agreement with the aforementioned.   This note is electronically signed by: Doy Mince 12/15/2015 1:31 PM

## 2015-12-15 ENCOUNTER — Ambulatory Visit (HOSPITAL_COMMUNITY): Payer: Self-pay | Admitting: Oncology

## 2015-12-15 ENCOUNTER — Encounter (HOSPITAL_COMMUNITY): Payer: Medicare Other

## 2015-12-15 ENCOUNTER — Other Ambulatory Visit (HOSPITAL_COMMUNITY): Payer: Self-pay

## 2015-12-15 ENCOUNTER — Encounter (HOSPITAL_COMMUNITY): Payer: Medicare Other | Attending: Oncology | Admitting: Oncology

## 2015-12-15 ENCOUNTER — Encounter (HOSPITAL_COMMUNITY): Payer: Self-pay | Admitting: Oncology

## 2015-12-15 VITALS — BP 127/79 | HR 81 | Temp 97.6°F | Resp 18 | Wt 193.2 lb

## 2015-12-15 DIAGNOSIS — D72829 Elevated white blood cell count, unspecified: Secondary | ICD-10-CM | POA: Diagnosis not present

## 2015-12-15 DIAGNOSIS — J449 Chronic obstructive pulmonary disease, unspecified: Secondary | ICD-10-CM | POA: Diagnosis not present

## 2015-12-15 DIAGNOSIS — J984 Other disorders of lung: Secondary | ICD-10-CM

## 2015-12-15 DIAGNOSIS — Z79899 Other long term (current) drug therapy: Secondary | ICD-10-CM | POA: Insufficient documentation

## 2015-12-15 DIAGNOSIS — Z720 Tobacco use: Secondary | ICD-10-CM

## 2015-12-15 DIAGNOSIS — Z880 Allergy status to penicillin: Secondary | ICD-10-CM | POA: Diagnosis not present

## 2015-12-15 DIAGNOSIS — F17210 Nicotine dependence, cigarettes, uncomplicated: Secondary | ICD-10-CM | POA: Diagnosis not present

## 2015-12-15 DIAGNOSIS — J45909 Unspecified asthma, uncomplicated: Secondary | ICD-10-CM | POA: Diagnosis not present

## 2015-12-15 DIAGNOSIS — R0902 Hypoxemia: Secondary | ICD-10-CM | POA: Diagnosis not present

## 2015-12-15 DIAGNOSIS — D72828 Other elevated white blood cell count: Secondary | ICD-10-CM

## 2015-12-15 DIAGNOSIS — D72820 Lymphocytosis (symptomatic): Secondary | ICD-10-CM | POA: Diagnosis not present

## 2015-12-15 LAB — CBC WITH DIFFERENTIAL/PLATELET
BASOS PCT: 0 %
Basophils Absolute: 0.1 10*3/uL (ref 0.0–0.1)
Eosinophils Absolute: 0.1 10*3/uL (ref 0.0–0.7)
Eosinophils Relative: 1 %
HEMATOCRIT: 46.5 % (ref 39.0–52.0)
HEMOGLOBIN: 15.6 g/dL (ref 13.0–17.0)
Lymphocytes Relative: 38 %
Lymphs Abs: 5.5 10*3/uL — ABNORMAL HIGH (ref 0.7–4.0)
MCH: 28.3 pg (ref 26.0–34.0)
MCHC: 33.5 g/dL (ref 30.0–36.0)
MCV: 84.4 fL (ref 78.0–100.0)
Monocytes Absolute: 1.3 10*3/uL — ABNORMAL HIGH (ref 0.1–1.0)
Monocytes Relative: 9 %
Neutro Abs: 7.7 10*3/uL (ref 1.7–7.7)
Neutrophils Relative %: 52 %
Platelets: 309 10*3/uL (ref 150–400)
RBC: 5.51 MIL/uL (ref 4.22–5.81)
RDW: 13.8 % (ref 11.5–15.5)
WBC: 14.7 10*3/uL — AB (ref 4.0–10.5)

## 2015-12-15 NOTE — Patient Instructions (Signed)
[place] at [facility] Discharge Instructions  RECOMMENDATIONS MADE BY THE CONSULTANT AND ANY TEST RESULTS WILL BE SENT TO YOUR REFERRING PHYSICIAN.  Labs today are stable.  No big changes.  We are waiting on the results from an additional blood test.  We should get it later this week. Will repeat labs in 6 months Return for follow-up in 6 months.  CBC    Component Value Date/Time   WBC 14.7* 12/15/2015 0909   RBC 5.51 12/15/2015 0909   HGB 15.6 12/15/2015 0909   HCT 46.5 12/15/2015 0909   PLT 309 12/15/2015 0909   MCV 84.4 12/15/2015 0909   MCH 28.3 12/15/2015 0909   MCHC 33.5 12/15/2015 0909   RDW 13.8 12/15/2015 0909   LYMPHSABS 5.5* 12/15/2015 0909   MONOABS 1.3* 12/15/2015 0909   EOSABS 0.1 12/15/2015 0909   BASOSABS 0.1 12/15/2015 [id number]    Thank you for choosing [place] at [facility] to provide your oncology and hematology care.  To afford each patient quality time with our provider, please arrive at least 15 minutes before your scheduled appointment time.   Beginning January 23rd 2017 lab work for the [facility] will be done in the  Main lab at [facility] on 1st floor. If you have a lab appointment with the [facility] please come in thru the  Main Entrance and check in at the main information desk  You need to re-schedule your appointment should you arrive 10 or more minutes late.  We strive to give you quality time with our providers, and arriving late affects you and other patients whose appointments are after yours.  Also, if you no show three or more times for appointments you may be dismissed from the clinic at the providers discretion.     Again, thank you for choosing [facility].  Our hope is that these requests will decrease the amount of time that you wait before being seen by our physicians.       _____________________________________________________________  Should you have questions after  your visit to [facility], please contact our office at [phone] between the hours of 8:30 a.m. and 4:30 p.m.  Voicemails left after 4:30 p.m. will not be returned until the following business day.  For prescription refill requests, have your pharmacy contact our office.         Resources For Cancer Patients and their Caregivers ? American Cancer Society: Can assist with transportation, wigs, general needs, runs Look Good Feel Better.        819-171-2068 ? Cancer Care: Provides financial assistance, online support groups, medication/co-pay assistance.  1-800-813-HOPE 479-866-3601) ? [facility] Assists [place] cancer patients and their families through emotional , educational and financial support.  667-434-3288 ? [place] DSS Where to apply for food stamps, Medicaid and utility assistance. (408) 530-8520 ? [facility]: Transportation to medical appointments. 351 304 1115 ? Social Security Administration: May apply for disability if have a Stage IV cancer. (865) 521-3666 559-285-4449 ? [employer], Disability and Transit Services: Assists with nutrition, care and transit needs. 6035305365

## 2016-04-28 ENCOUNTER — Emergency Department (HOSPITAL_COMMUNITY)
Admission: EM | Admit: 2016-04-28 | Discharge: 2016-04-28 | Disposition: A | Discharge status: Home / Self Care | Payer: No Typology Code available for payment source | Attending: Emergency Medicine | Admitting: Emergency Medicine

## 2016-04-28 ENCOUNTER — Encounter (HOSPITAL_COMMUNITY): Payer: Self-pay | Admitting: Emergency Medicine

## 2016-04-28 ENCOUNTER — Emergency Department (HOSPITAL_COMMUNITY): Payer: No Typology Code available for payment source

## 2016-04-28 DIAGNOSIS — F17210 Nicotine dependence, cigarettes, uncomplicated: Secondary | ICD-10-CM | POA: Insufficient documentation

## 2016-04-28 DIAGNOSIS — Y92481 Parking lot as the place of occurrence of the external cause: Secondary | ICD-10-CM | POA: Insufficient documentation

## 2016-04-28 DIAGNOSIS — S161XXA Strain of muscle, fascia and tendon at neck level, initial encounter: Secondary | ICD-10-CM | POA: Diagnosis not present

## 2016-04-28 DIAGNOSIS — M545 Low back pain, unspecified: Secondary | ICD-10-CM

## 2016-04-28 DIAGNOSIS — F909 Attention-deficit hyperactivity disorder, unspecified type: Secondary | ICD-10-CM | POA: Diagnosis not present

## 2016-04-28 DIAGNOSIS — S199XXA Unspecified injury of neck, initial encounter: Secondary | ICD-10-CM | POA: Diagnosis present

## 2016-04-28 DIAGNOSIS — Y999 Unspecified external cause status: Secondary | ICD-10-CM | POA: Insufficient documentation

## 2016-04-28 DIAGNOSIS — Y939 Activity, unspecified: Secondary | ICD-10-CM | POA: Insufficient documentation

## 2016-04-28 DIAGNOSIS — J45909 Unspecified asthma, uncomplicated: Secondary | ICD-10-CM | POA: Diagnosis not present

## 2016-04-28 DIAGNOSIS — Z79899 Other long term (current) drug therapy: Secondary | ICD-10-CM | POA: Diagnosis not present

## 2016-04-28 DIAGNOSIS — S3992XA Unspecified injury of lower back, initial encounter: Secondary | ICD-10-CM | POA: Diagnosis not present

## 2016-04-28 DIAGNOSIS — M542 Cervicalgia: Secondary | ICD-10-CM | POA: Diagnosis not present

## 2016-04-28 HISTORY — DX: Other specified behavioral and emotional disorders with onset usually occurring in childhood and adolescence: F98.8

## 2016-04-28 MED ORDER — CYCLOBENZAPRINE HCL 5 MG PO TABS: 5.0000 mg | ORAL_TABLET | Freq: Three times a day (TID) | ORAL | 0 refills | Status: DC

## 2016-04-28 MED ORDER — NAPROXEN 500 MG PO TABS: 500.0000 mg | ORAL_TABLET | Freq: Two times a day (BID) | ORAL | 0 refills | Status: DC
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
## 2016-05-02 DIAGNOSIS — Z0000 Encounter for general adult medical examination without abnormal findings: Secondary | ICD-10-CM | POA: Diagnosis not present

## 2016-05-02 DIAGNOSIS — K219 Gastro-esophageal reflux disease without esophagitis: Secondary | ICD-10-CM | POA: Diagnosis not present

## 2016-05-02 DIAGNOSIS — E6609 Other obesity due to excess calories: Secondary | ICD-10-CM | POA: Diagnosis not present

## 2016-05-02 DIAGNOSIS — Z6830 Body mass index (BMI) 30.0-30.9, adult: Secondary | ICD-10-CM | POA: Diagnosis not present

## 2016-05-02 DIAGNOSIS — J45909 Unspecified asthma, uncomplicated: Secondary | ICD-10-CM | POA: Diagnosis not present

## 2016-05-02 DIAGNOSIS — Z125 Encounter for screening for malignant neoplasm of prostate: Secondary | ICD-10-CM | POA: Diagnosis not present

## 2016-05-02 DIAGNOSIS — Z1389 Encounter for screening for other disorder: Secondary | ICD-10-CM | POA: Diagnosis not present

## 2016-06-15 ENCOUNTER — Encounter (HOSPITAL_COMMUNITY): Payer: Medicare Other

## 2016-06-15 ENCOUNTER — Encounter (HOSPITAL_COMMUNITY): Payer: Medicare Other | Attending: Hematology & Oncology | Admitting: Hematology & Oncology

## 2016-06-15 ENCOUNTER — Encounter (HOSPITAL_COMMUNITY): Payer: Self-pay | Admitting: Hematology & Oncology

## 2016-06-15 VITALS — BP 109/61 | HR 51 | Temp 97.6°F | Resp 16 | Wt 205.6 lb

## 2016-06-15 DIAGNOSIS — J45909 Unspecified asthma, uncomplicated: Secondary | ICD-10-CM

## 2016-06-15 DIAGNOSIS — D72829 Elevated white blood cell count, unspecified: Secondary | ICD-10-CM

## 2016-06-15 DIAGNOSIS — F17200 Nicotine dependence, unspecified, uncomplicated: Secondary | ICD-10-CM

## 2016-06-15 DIAGNOSIS — Z720 Tobacco use: Secondary | ICD-10-CM

## 2016-06-15 DIAGNOSIS — D72828 Other elevated white blood cell count: Secondary | ICD-10-CM

## 2016-06-15 DIAGNOSIS — D729 Disorder of white blood cells, unspecified: Secondary | ICD-10-CM

## 2016-06-15 DIAGNOSIS — J4520 Mild intermittent asthma, uncomplicated: Secondary | ICD-10-CM

## 2016-06-15 LAB — CBC WITH DIFFERENTIAL/PLATELET
BASOS PCT: 0 %
Basophils Absolute: 0.1 10*3/uL (ref 0.0–0.1)
Eosinophils Absolute: 0.2 10*3/uL (ref 0.0–0.7)
Eosinophils Relative: 2 %
HCT: 46.9 % (ref 39.0–52.0)
Hemoglobin: 15.3 g/dL (ref 13.0–17.0)
Lymphocytes Relative: 41 %
Lymphs Abs: 5.5 10*3/uL — ABNORMAL HIGH (ref 0.7–4.0)
MCH: 27.9 pg (ref 26.0–34.0)
MCHC: 32.6 g/dL (ref 30.0–36.0)
MCV: 85.6 fL (ref 78.0–100.0)
Monocytes Absolute: 1.2 10*3/uL — ABNORMAL HIGH (ref 0.1–1.0)
Monocytes Relative: 9 %
NEUTROS PCT: 48 %
Neutro Abs: 6.6 10*3/uL (ref 1.7–7.7)
PLATELETS: 283 10*3/uL (ref 150–400)
RBC: 5.48 MIL/uL (ref 4.22–5.81)
RDW: 14 % (ref 11.5–15.5)
WBC: 13.6 10*3/uL — AB (ref 4.0–10.5)

## 2016-06-15 NOTE — Patient Instructions (Addendum)
[facility] at [facility] Discharge Instructions  RECOMMENDATIONS MADE BY THE CONSULTANT AND ANY TEST RESULTS WILL BE SENT TO YOUR REFERRING PHYSICIAN.  You saw Dr.Penland today. Follow up in 6 months with lab work. See Amy at checkout for appointments.  Thank you for choosing [facility] at [facility] to provide your oncology and hematology care.  To afford each patient quality time with our provider, please arrive at least 15 minutes before your scheduled appointment time.   Beginning January 23rd 2017 lab work for the Cancer Center will be done in the  Main lab at [facility] on 1st floor. If you have a lab appointment with the Cancer Center please come in thru the  Main Entrance and check in at the main information desk  You need to re-schedule your appointment should you arrive 10 or more minutes late.  We strive to give you quality time with our providers, and arriving late affects you and other patients whose appointments are after yours.  Also, if you no show three or more times for appointments you may be dismissed from the clinic at the providers discretion.     Again, thank you for choosing [facility].  Our hope is that these requests will decrease the amount of time that you wait before being seen by our physicians.       _____________________________________________________________  Should you have questions after your visit to [facility], please contact our office at [phone] between the hours of 8:30 a.m. and 4:30 p.m.  Voicemails left after 4:30 p.m. will not be returned until the following business day.  For prescription refill requests, have your pharmacy contact our office.         Resources For Cancer Patients and their Caregivers ? American Cancer Society: Can assist with transportation, wigs, general needs, runs Look Good Feel Better.        1-888-227-6333 ? Cancer Care: Provides  financial assistance, online support groups, medication/co-pay assistance.  1-800-813-HOPE (4673) ? [facility] Assists [place] cancer patients and their families through emotional , educational and financial support.  336-427-4357 ? [place] DSS Where to apply for food stamps, Medicaid and utility assistance. 336-342-1394 ? [facility]: Transportation to medical appointments. 336-347-2287 ? Social Security Administration: May apply for disability if have a Stage IV cancer. 336-342-7796 1-800-772-1213 ? [place] Aging, Disability and Transit Services: Assists with nutrition, care and transit needs. 336-349-2343  Cancer Center Support Programs: @10RELATIVEDAYS@ > Cancer Support Group  2nd Tuesday of the month 1pm-2pm, Journey Room  > Creative Journey  3rd Tuesday of the month 1130am-1pm, Journey Room  > Look Good Feel Better  1st Wednesday of the month 10am-12 noon, Journey Room (Call American Cancer Society to register 1-800-395-5775)    

## 2016-06-15 NOTE — Progress Notes (Signed)
[facility] NOTE  Patient Care Team: Redmond School, MD as PCP - General (Internal Medicine)  CHIEF COMPLAINTS/PURPOSE OF CONSULTATION:  Leukocytosis  HISTORY OF PRESENTING ILLNESS: Bruce Nichols 30 y.o. male is here for a follow-up of leukocytosis.   Patient is accompanied by his mother. He reports a normal appetite and says he is feeling well overall.   He says he has cut back on smoking. Bruce Nichols reports smoking 1 pack a week now.  He denies night sweats, fever or chills. No change in baseline energy or activity. No abdominal pain, no weight loss.  MEDICAL HISTORY:  Past Medical History:  Diagnosis Date  . ADD (attention deficit disorder)     SURGICAL HISTORY: Past Surgical History:  Procedure Laterality Date  . MOLE REMOVAL     right side of face    SOCIAL HISTORY: Social History   Social History  . Marital status: Single    Spouse name: N/A  . Number of children: N/A  . Years of education: N/A   Occupational History  . Not on file.   Social History Main Topics  . Smoking status: Current Every Day Smoker    Packs/day: 2.00    Types: Cigarettes  . Smokeless tobacco: Never Used  . Alcohol use Yes     Comment: occasionally  . Drug use: No  . Sexual activity: Not on file   Other Topics Concern  . Not on file   Social History Narrative  . 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  He lives at home with his mom; his parents are divorced. He smokes; his mother says "he's really not bad;" she says a pack will last him 3-4 days She says he was in his late 20's before he started smoking, and she feels he only started because of social reasons. He says he occasionally drinks alcohol; his mother says about once a month He says he can't lie about it, he occasionally uses marijuana He is ADD and takes generic  adderol  FAMILY HISTORY: History reviewed. No pertinent family history. has no family status information on file.     His mother is 35, but will be 2 on the 21st.  She has high blood pressure, but that's her only health concern she reports They are both heavier His father is around 11 and has had heart surgery, and his lungs have collapsed on him before His father is a chain smoker  He has one half sister; she is 33 now he thinks  ALLERGIES:  is allergic to bee venom and penicillins.  MEDICATIONS:  Current Outpatient Prescriptions  Medication Sig Dispense Refill  . albuterol (PROVENTIL HFA;VENTOLIN HFA) 108 (90 Base) MCG/ACT inhaler Inhale 1 puff into the lungs every 6 (six) hours as needed for wheezing or shortness of breath.    . amphetamine-dextroamphetamine (ADDERALL) 20 MG tablet Take 20 mg by mouth daily.    . cyclobenzaprine (FLEXERIL) 5 MG tablet Take 1 tablet (5 mg total) by mouth 3 (three) times daily. 15 tablet 0  . naproxen (NAPROSYN) 500 MG tablet Take 1 tablet (500 mg total) by mouth 2 (two) times daily. 30 tablet 0   No current facility-administered medications for this visit.     Review of Systems  Constitutional: Negative.   HENT: Negative.   Eyes: Negative.   Respiratory: Negative.  Cardiovascular: Negative.   Gastrointestinal: Negative.   Genitourinary: Negative.   Musculoskeletal: Negative.   Skin: Negative.   Neurological: Negative.   Endo/Heme/Allergies: Negative.   Psychiatric/Behavioral: Negative.   All other systems reviewed and are negative.  14 point ROS was done and is otherwise as detailed above or in HPI  PHYSICAL EXAMINATION: ECOG PERFORMANCE STATUS: 0 - Asymptomatic  Vitals:   06/15/16 1100  BP: 109/61  Pulse: (!) 51  Resp: 16  Temp: 97.6 F (36.4 C)   Filed Weights   06/15/16 1100  Weight: 205 lb 9.6 oz (93.3 kg)   Physical Exam  Constitutional: He is oriented to person, place, and time and well-developed, well-nourished,  and in no distress.  Overweight.  HENT:  Head: Normocephalic and atraumatic.  Nose: Nose normal.  Mouth/Throat: Oropharynx is clear and moist. No oropharyngeal exudate.  Eyes: Conjunctivae and EOM are normal. Pupils are equal, round, and reactive to light. Right eye exhibits no discharge. Left eye exhibits no discharge. No scleral icterus.  Neck: Normal range of motion. Neck supple. No tracheal deviation present. No thyromegaly present.  Cardiovascular: Normal rate, regular rhythm and normal heart sounds.  Exam reveals no gallop and no friction rub.   No murmur heard. Pulmonary/Chest: Effort normal and breath sounds normal. He has no wheezes. He has no rales.  Abdominal: Soft. Bowel sounds are normal. He exhibits no distension and no mass. There is no tenderness. There is no rebound and no guarding.  Musculoskeletal: Normal range of motion. He exhibits no edema.  Lymphadenopathy:    He has no cervical adenopathy.  Neurological: He is alert and oriented to person, place, and time. He has normal reflexes. No cranial nerve deficit. Gait normal. Coordination normal.  Skin: Skin is warm and dry. No rash noted.  Psychiatric: Mood, memory, affect and judgment normal.  Nursing note and vitals reviewed.   LABORATORY DATA:  I have reviewed the data as listed LResults for Bruce, Nichols (MRN [id number]) as of 06/15/2016 11:07  Ref. Range 06/15/2016 10:30  WBC Latest Ref Range: 4.0 - 10.5 K/uL 13.6 (H)  RBC Latest Ref Range: 4.22 - 5.81 MIL/uL 5.48  Hemoglobin Latest Ref Range: 13.0 - 17.0 g/dL 15.3  HCT Latest Ref Range: 39.0 - 52.0 % 46.9  MCV Latest Ref Range: 78.0 - 100.0 fL 85.6  MCH Latest Ref Range: 26.0 - 34.0 pg 27.9  MCHC Latest Ref Range: 30.0 - 36.0 g/dL 32.6  RDW Latest Ref Range: 11.5 - 15.5 % 14.0  Platelets Latest Ref Range: 150 - 400 K/uL 283  Neutrophils Latest Units: % 48  Lymphocytes Latest Units: % 41  Monocytes Relative Latest Units: % 9  Eosinophil Latest Units: % 2   Basophil Latest Units: % 0  NEUT# Latest Ref Range: 1.7 - 7.7 K/uL 6.6  Lymphocyte # Latest Ref Range: 0.7 - 4.0 K/uL 5.5 (H)  Monocyte # Latest Ref Range: 0.1 - 1.0 K/uL 1.2 (H)  Eosinophils Absolute Latest Ref Range: 0.0 - 0.7 K/uL 0.2  Basophils Absolute Latest Ref Range: 0.0 - 0.1 K/uL 0.1        Results for ADANTE, COURINGTON (MRN [id number])   Ref. Range 08/27/2015 16:10 12/15/2015 00:00 12/15/2015 09:09 04/28/2016 18:48 06/15/2016 10:30  WBC Latest Ref Range: 4.0 - 10.5 K/uL 13.6 (H)  14.7 (H)  13.6 (H)    ASSESSMENT & PLAN:  Leukocytosis Tobacco Use Negative flow cytometry, BCR-ABL Asthma  At this point I feel his neutrophilia/leukocytosis may be secondary to  his ongoing tobacco use.  I have recommended ongoing observation.  He will return in several months with repeat CBC with diff.  I addressed the importance of smoking cessation with the patient in detail.  We discussed the health benefits of cessation.  We discussed the health detriments of ongoing tobacco use including but not limited to COPD, heart disease and malignancy. We reviewed the multiple options for cessation and I offered to refer him to smoking cessation classes. We discussed other alternatives to quit such as chantix, wellbutrin. We will continue to address this moving forward.  I will follow up with Corene Cornea in six months. If his counts remain stable, we can discuss sending him back to PCP or we can do a bone marrow biopsy, although I do not have reason to believe the latter is necessary given negative PE, and flow cytometry.   Orders Placed This Encounter  Procedures  . CBC with Differential    Standing Status:   Future    Standing Expiration Date:   06/15/2017  . Comprehensive metabolic panel    Standing Status:   Future    Standing Expiration Date:   06/15/2017  . Pathologist smear review    Standing Status:   Future    Standing Expiration Date:   06/15/2017    All questions were answered. The patient knows  to call the clinic with any problems, questions or concerns.  This document serves as a record of services personally performed by Ancil Linsey, MD. It was created on her behalf by Elmyra Ricks, a trained medical scribe. The creation of this record is based on the scribe's personal observations and the provider's statements to them. This document has been checked and approved by the attending provider.]  I have reviewed the above documentation for accuracy and completeness and I agree with the above.  This note was electronically signed.    Molli Hazard, MD  06/15/2016 12:20 PM

## 2016-07-14 ENCOUNTER — Encounter (HOSPITAL_COMMUNITY): Payer: Self-pay | Admitting: Hematology & Oncology

## 2016-11-28 DIAGNOSIS — L089 Local infection of the skin and subcutaneous tissue, unspecified: Secondary | ICD-10-CM | POA: Diagnosis not present

## 2016-11-28 DIAGNOSIS — Z1389 Encounter for screening for other disorder: Secondary | ICD-10-CM | POA: Diagnosis not present

## 2016-11-29 ENCOUNTER — Emergency Department (HOSPITAL_COMMUNITY)
Admission: EM | Admit: 2016-11-29 | Discharge: 2016-11-29 | Disposition: A | Discharge status: Home / Self Care | Payer: Medicare Other | Attending: Emergency Medicine | Admitting: Emergency Medicine

## 2016-11-29 ENCOUNTER — Encounter (HOSPITAL_COMMUNITY): Payer: Self-pay

## 2016-11-29 DIAGNOSIS — H9201 Otalgia, right ear: Secondary | ICD-10-CM | POA: Diagnosis present

## 2016-11-29 DIAGNOSIS — L089 Local infection of the skin and subcutaneous tissue, unspecified: Secondary | ICD-10-CM | POA: Diagnosis not present

## 2016-11-29 DIAGNOSIS — H6001 Abscess of right external ear: Secondary | ICD-10-CM | POA: Diagnosis not present

## 2016-11-29 DIAGNOSIS — J45909 Unspecified asthma, uncomplicated: Secondary | ICD-10-CM | POA: Insufficient documentation

## 2016-11-29 DIAGNOSIS — Z6831 Body mass index (BMI) 31.0-31.9, adult: Secondary | ICD-10-CM | POA: Diagnosis not present

## 2016-11-29 DIAGNOSIS — F17210 Nicotine dependence, cigarettes, uncomplicated: Secondary | ICD-10-CM | POA: Insufficient documentation

## 2016-11-29 HISTORY — DX: Scoliosis, unspecified: M41.9

## 2016-11-29 HISTORY — DX: Unspecified asthma, uncomplicated: J45.909

## 2016-11-29 MED ORDER — LIDOCAINE HCL (PF) 2 % IJ SOLN
INTRAMUSCULAR | Status: AC
  Filled 2016-11-29: qty 10

## 2016-11-29 MED ORDER — HYDROCODONE-ACETAMINOPHEN 5-325 MG PO TABS: ORAL_TABLET | ORAL | 0 refills | Status: AC

## 2016-11-29 NOTE — ED Notes (Signed)
Swelling and redness behind and on left ear lobe

## 2016-11-29 NOTE — Discharge Instructions (Signed)
Continue taking your antibiotic as directed.  Warm wet compresses on/off behind your ear 2-3 times a day.  Return here on Friday for recheck and packing removal

## 2016-11-29 NOTE — ED Triage Notes (Signed)
Pt reports that a pimple like area popped up behind right ear on Saturday. Went to Dr. Monday and given a a prescription for Bactrim and mucipirion ointment that he started. Area behind ear is swollen and ear lobe is red and swollen. Pt complaining of jaw pain as well

## 2016-11-29 NOTE — ED Provider Notes (Signed)
[facility] DEPT Provider Note   CSN: [id number] Arrival date & time: 11/29/16  5573     History   Chief Complaint Chief Complaint  Patient presents with  . Otalgia  . Abscess    HPI Bruce Nichols is a 31 y.o. male.  HPI   Bruce Nichols is a 31 y.o. male who presents to the Emergency Department complaining of swelling, pain and redness of the posterior right earlobe.  Symptoms began 4 days ago.  Noticed a "pimple" to the area that increased in size and severity after he tried squeezing it.  Symptoms have progressed the the earlobe as well.  He was seen by his PCP two days ago and started on Bactroban cream and Bactrim.  He reports chills and pain to the area with flexion of his neck and chewing.  He denies fever, vomiting, facial swelling, and dental pain   Past Medical History:  Diagnosis Date  . ADD (attention deficit disorder)   . Asthma   . Scoliosis     Patient Active Problem List   Diagnosis Date Noted  . Leukocytosis 08/27/2015  . Asthma 08/27/2015    Past Surgical History:  Procedure Laterality Date  . MOLE REMOVAL     right side of face       Home Medications    Prior to Admission medications   Medication Sig Start Date End Date Taking? Authorizing Provider  albuterol (PROVENTIL HFA;VENTOLIN HFA) 108 (90 Base) MCG/ACT inhaler Inhale 1 puff into the lungs every 6 (six) hours as needed for wheezing or shortness of breath.   Yes Historical Provider, MD  amphetamine-dextroamphetamine (ADDERALL) 20 MG tablet Take 20 mg by mouth daily.   Yes Historical Provider, MD  mupirocin ointment (BACTROBAN) 2 % Apply 1 application topically 2 (two) times daily.   Yes Historical Provider, MD  sulfamethoxazole-trimethoprim (BACTRIM DS,SEPTRA DS) 800-160 MG tablet Take 2 tablets by mouth 2 (two) times daily. 7 day course starting on 11/28/2016   Yes Historical Provider, MD    Family History No family history on file.  Social History Social History  Substance Use  Topics  . Smoking status: Current Every Day Smoker    Packs/day: 1.00    Types: Cigarettes  . Smokeless tobacco: Never Used  . Alcohol use Yes     Comment: occasionally     Allergies   Bee venom and Penicillins   Review of Systems Review of Systems  Constitutional: Negative for chills and fever.  HENT: Negative for facial swelling, sore throat and trouble swallowing.        Pain, redness and swelling of the right earlobe.   Gastrointestinal: Negative for nausea and vomiting.  Musculoskeletal: Negative for arthralgias and joint swelling.  Skin: Positive for color change.       Abscess   Hematological: Negative for adenopathy.  All other systems reviewed and are negative.    Physical Exam Updated Vital Signs BP (!) 132/93   Pulse 74   Temp 97.9 F (36.6 C) (Oral)   Resp 18   Ht 5\' 7"  (1.702 m)   Wt 92.5 kg   SpO2 96%   BMI 31.95 kg/m   Physical Exam  Constitutional: He is oriented to person, place, and time. He appears well-developed and well-nourished. No distress.  HENT:  Head: Normocephalic and atraumatic.  Right Ear: Tympanic membrane normal. No drainage. No mastoid tenderness.  Left Ear: Tympanic membrane and external ear normal. No drainage. No mastoid tenderness.  Mouth/Throat: Oropharynx is  clear and moist.  Eyes: EOM are normal. Pupils are equal, round, and reactive to light.  Neck: Normal range of motion. No tracheal deviation present.  Cardiovascular: Normal rate, regular rhythm and normal heart sounds.   No murmur heard. Pulmonary/Chest: Effort normal and breath sounds normal. No respiratory distress.  Musculoskeletal: Normal range of motion.  Lymphadenopathy:    He has no cervical adenopathy.  Neurological: He is alert and oriented to person, place, and time. He exhibits normal muscle tone. Coordination normal.  Skin: Skin is warm and dry. There is erythema.  Localized erythema, fluctuance and edema of the right earlobe and postauricular region.   No mastoid edema or tenderness.   Nursing note and vitals reviewed.    ED Treatments / Results  Labs (all labs ordered are listed, but only abnormal results are displayed) Labs Reviewed - No data to display  EKG  EKG Interpretation None       Radiology No results found.  Procedures Procedures (including critical care time)  INCISION AND DRAINAGE Performed by: Hale Bogus. Consent: Verbal consent obtained. Risks and benefits: risks, benefits and alternatives were discussed Type: abscess  Body area: posterior right earlobe  Anesthesia: local infiltration  Incision was made with a #11 scalpel.  Local anesthetic: lidocaine 2 % w/o epinephrine  Anesthetic total: 2 ml  Complexity: complex Blunt dissection to break up loculations  Drainage: purulent  Drainage amount: large  Packing material: 1/4 in iodoform gauze  Patient tolerance: Patient tolerated the procedure well with no immediate complications.      Medications Ordered in ED Medications  lidocaine (XYLOCAINE) 2 % injection (not administered)     Initial Impression / Assessment and Plan / ED Course  I have reviewed the triage vital signs and the nursing notes.  Pertinent labs & imaging results that were available during my care of the patient were reviewed by me and considered in my medical decision making (see chart for details).     Pt with abscess to posterior earlobe.  Currently taking Bactrim prescribed from PCP.  Pain improved after I&D, plan will include continuing abx, warm compresses and ER return in 2 day for recheck and packing removal.    Final Clinical Impressions(s) / ED Diagnoses   Final diagnoses:  Abscess of earlobe, right    New Prescriptions New Prescriptions   No medications on file     Kem Parkinson, Hershal Coria 11/29/16 [place], MD 11/30/16 1606

## 2016-12-01 ENCOUNTER — Encounter (HOSPITAL_COMMUNITY): Payer: Self-pay | Admitting: Emergency Medicine

## 2016-12-01 ENCOUNTER — Emergency Department (HOSPITAL_COMMUNITY)
Admission: EM | Admit: 2016-12-01 | Discharge: 2016-12-01 | Disposition: A | Discharge status: Home / Self Care | Payer: Medicare Other | Attending: Emergency Medicine | Admitting: Emergency Medicine

## 2016-12-01 DIAGNOSIS — F17210 Nicotine dependence, cigarettes, uncomplicated: Secondary | ICD-10-CM | POA: Insufficient documentation

## 2016-12-01 DIAGNOSIS — Z09 Encounter for follow-up examination after completed treatment for conditions other than malignant neoplasm: Secondary | ICD-10-CM

## 2016-12-01 DIAGNOSIS — Z48817 Encounter for surgical aftercare following surgery on the skin and subcutaneous tissue: Secondary | ICD-10-CM | POA: Insufficient documentation

## 2016-12-01 DIAGNOSIS — Z4802 Encounter for removal of sutures: Secondary | ICD-10-CM | POA: Diagnosis present

## 2016-12-01 DIAGNOSIS — J45909 Unspecified asthma, uncomplicated: Secondary | ICD-10-CM | POA: Diagnosis not present

## 2016-12-01 DIAGNOSIS — Z4801 Encounter for change or removal of surgical wound dressing: Secondary | ICD-10-CM | POA: Diagnosis not present

## 2016-12-01 DIAGNOSIS — Z79899 Other long term (current) drug therapy: Secondary | ICD-10-CM | POA: Diagnosis not present

## 2016-12-01 MED ORDER — TRIAMCINOLONE ACETONIDE 0.1 % EX CREA: 1.0000 "application " | TOPICAL_CREAM | Freq: Three times a day (TID) | CUTANEOUS | 0 refills | Status: DC

## 2016-12-01 MED ORDER — DIPHENHYDRAMINE HCL 25 MG PO TABS: 25.0000 mg | ORAL_TABLET | ORAL | 0 refills | Status: DC | PRN

## 2016-12-01 NOTE — ED Triage Notes (Signed)
Pt needs packing removal from behind right ear

## 2016-12-01 NOTE — ED Triage Notes (Signed)
Seen here for abscess behind ear- Here for packing removal and recheck  PCP is Dr Armandina Gemma

## 2016-12-01 NOTE — Discharge Instructions (Signed)
Continue your antibiotic as directed and the warm wet compresses until the area heal completely.  Return if needed.

## 2016-12-01 NOTE — ED Notes (Signed)
Patient verbalizes understanding of discharge instructions, home care and follow up care. Patient out of department at this time. 

## 2016-12-05 NOTE — ED Provider Notes (Signed)
[facility] DEPT Provider Note   CSN: [id number] Arrival date & time: 12/01/16  1628     History   Chief Complaint Chief Complaint  Patient presents with  . Suture / Staple Removal    packing    HPI Bruce Nichols is a 31 y.o. male.  HPI  Bruce Nichols is a 31 y.o. male who returns to the Emergency Department for recheck of an I&D of the right earlobe that was performed two days prior to this visit.  The procedure was performed by me.  Patient reports improvement of the pain and swelling to the right ear.  He has been taking antibiotic prescribed and applying warm wet compresses.  He denies worsening pain, fever or vomiting  Past Medical History:  Diagnosis Date  . ADD (attention deficit disorder)   . Asthma   . Scoliosis     Patient Active Problem List   Diagnosis Date Noted  . Leukocytosis 08/27/2015  . Asthma 08/27/2015    Past Surgical History:  Procedure Laterality Date  . MOLE REMOVAL     right side of face       Home Medications    Prior to Admission medications   Medication Sig Start Date End Date Taking? Authorizing Provider  albuterol (PROVENTIL HFA;VENTOLIN HFA) 108 (90 Base) MCG/ACT inhaler Inhale 1 puff into the lungs every 6 (six) hours as needed for wheezing or shortness of breath.    Historical Provider, MD  amphetamine-dextroamphetamine (ADDERALL) 20 MG tablet Take 20 mg by mouth daily.    Historical Provider, MD  HYDROcodone-acetaminophen (NORCO/VICODIN) 5-325 MG tablet Take one-two tabs po q 4-6 hrs prn pain 11/29/16   Angelino Rumery, PA-C  sulfamethoxazole-trimethoprim (BACTRIM DS,SEPTRA DS) 800-160 MG tablet Take 2 tablets by mouth 2 (two) times daily. 7 day course starting on 11/28/2016    Historical Provider, MD    Family History History reviewed. No pertinent family history.  Social History Social History  Substance Use Topics  . Smoking status: Current Every Day Smoker    Packs/day: 1.00    Types: Cigarettes  . Smokeless  tobacco: Never Used  . Alcohol use Yes     Comment: occasionally     Allergies   Bee venom and Penicillins   Review of Systems Review of Systems  Constitutional: Negative for chills and fever.  HENT:       Abscess to right ear  Respiratory: Negative for shortness of breath.   Gastrointestinal: Negative for nausea and vomiting.  Musculoskeletal: Negative for arthralgias and joint swelling.  Skin: Positive for color change.       Abscess   Hematological: Negative for adenopathy.  All other systems reviewed and are negative.    Physical Exam Updated Vital Signs BP 133/89 (BP Location: Right Arm)   Pulse 98   Temp 98.1 F (36.7 C) (Temporal)   Resp 17   Ht 5\' 7"  (1.702 m)   Wt 92.5 kg   SpO2 94%   BMI 31.95 kg/m   Physical Exam  Constitutional: He is oriented to person, place, and time. He appears well-developed and well-nourished. No distress.  HENT:  Head: Normocephalic and atraumatic.  Mouth/Throat: Uvula is midline, oropharynx is clear and moist and mucous membranes are normal. No uvula swelling. No oropharyngeal exudate.  Mild erythema with improving edema of the right earlobe and postauricular area. Packing in place. No mastoid tenderness  Neck: Normal range of motion. Neck supple.  Cardiovascular: Normal rate, regular rhythm, normal heart sounds and  intact distal pulses.   No murmur heard. Pulmonary/Chest: Effort normal and breath sounds normal. No stridor. No respiratory distress.  Lymphadenopathy:    He has no cervical adenopathy.  Neurological: He is alert and oriented to person, place, and time. Coordination normal.  Skin: Skin is warm and dry. No rash noted.  Nursing note and vitals reviewed.    ED Treatments / Results  Labs (all labs ordered are listed, but only abnormal results are displayed) Labs Reviewed - No data to display  EKG  EKG Interpretation None       Radiology No results found.  Procedures Procedures (including critical  care time)  Medications Ordered in ED Medications - No data to display   Initial Impression / Assessment and Plan / ED Course  I have reviewed the triage vital signs and the nursing notes.  Pertinent labs & imaging results that were available during my care of the patient were reviewed by me and considered in my medical decision making (see chart for details).     Sx's improving.  Pt reports pain improved.    Packing removed by w/o difficulty.  Pt agrees to continue abx and compresses.  Advised to return if needed.  Final Clinical Impressions(s) / ED Diagnoses   Final diagnoses:  Encounter for recheck of abscess following incision and drainage    New Prescriptions Discharge Medication List as of 12/01/2016  5:09 PM       Alano Blasco Ammie Ferrier 12/05/16 2134    Isla Pence, MD 12/05/16 2240

## 2016-12-14 ENCOUNTER — Ambulatory Visit (HOSPITAL_COMMUNITY): Payer: Self-pay | Admitting: Oncology

## 2016-12-14 ENCOUNTER — Other Ambulatory Visit (HOSPITAL_COMMUNITY): Payer: Self-pay

## 2017-01-02 ENCOUNTER — Encounter (HOSPITAL_COMMUNITY): Payer: Medicare Other | Attending: Oncology

## 2017-01-02 ENCOUNTER — Encounter (HOSPITAL_COMMUNITY): Payer: Self-pay | Admitting: Oncology

## 2017-01-02 ENCOUNTER — Encounter (HOSPITAL_BASED_OUTPATIENT_CLINIC_OR_DEPARTMENT_OTHER): Payer: Medicare Other | Admitting: Oncology

## 2017-01-02 DIAGNOSIS — Z720 Tobacco use: Secondary | ICD-10-CM | POA: Diagnosis not present

## 2017-01-02 DIAGNOSIS — D72828 Other elevated white blood cell count: Secondary | ICD-10-CM

## 2017-01-02 DIAGNOSIS — D72820 Lymphocytosis (symptomatic): Secondary | ICD-10-CM

## 2017-01-02 DIAGNOSIS — D72821 Monocytosis (symptomatic): Secondary | ICD-10-CM

## 2017-01-02 DIAGNOSIS — D72823 Leukemoid reaction: Secondary | ICD-10-CM | POA: Insufficient documentation

## 2017-01-02 DIAGNOSIS — D72829 Elevated white blood cell count, unspecified: Secondary | ICD-10-CM | POA: Diagnosis not present

## 2017-01-02 LAB — CBC WITH DIFFERENTIAL/PLATELET
Basophils Absolute: 0.1 10*3/uL (ref 0.0–0.1)
Basophils Relative: 0 %
Eosinophils Absolute: 0.2 10*3/uL (ref 0.0–0.7)
Eosinophils Relative: 1 %
HCT: 45.2 % (ref 39.0–52.0)
Hemoglobin: 15 g/dL (ref 13.0–17.0)
Lymphocytes Relative: 36 %
Lymphs Abs: 4.3 10*3/uL — ABNORMAL HIGH (ref 0.7–4.0)
MCH: 28.4 pg (ref 26.0–34.0)
MCHC: 33.2 g/dL (ref 30.0–36.0)
MCV: 85.6 fL (ref 78.0–100.0)
MONO ABS: 1 10*3/uL (ref 0.1–1.0)
Monocytes Relative: 9 %
Neutro Abs: 6.2 10*3/uL (ref 1.7–7.7)
Neutrophils Relative %: 53 %
Platelets: 243 10*3/uL (ref 150–400)
RBC: 5.28 MIL/uL (ref 4.22–5.81)
RDW: 14 % (ref 11.5–15.5)
WBC: 11.7 10*3/uL — ABNORMAL HIGH (ref 4.0–10.5)

## 2017-01-02 LAB — COMPREHENSIVE METABOLIC PANEL
ALBUMIN: 3.9 g/dL (ref 3.5–5.0)
ALT: 74 U/L — ABNORMAL HIGH (ref 17–63)
AST: 42 U/L — AB (ref 15–41)
Alkaline Phosphatase: 59 U/L (ref 38–126)
Anion gap: 7 (ref 5–15)
BUN: 10 mg/dL (ref 6–20)
CHLORIDE: 107 mmol/L (ref 101–111)
CO2: 24 mmol/L (ref 22–32)
CREATININE: 0.83 mg/dL (ref 0.61–1.24)
Calcium: 8.3 mg/dL — ABNORMAL LOW (ref 8.9–10.3)
GFR calc Af Amer: >60 mL/min (ref >60)
GFR calc non Af Amer: >60 mL/min (ref >60)
Glucose, Bld: 90 mg/dL (ref 65–99)
Potassium: 3.9 mmol/L (ref 3.5–5.1)
Sodium: 138 mmol/L (ref 135–145)
Total Bilirubin: 0.6 mg/dL (ref 0.3–1.2)
Total Protein: 7 g/dL (ref 6.5–8.1)

## 2017-01-02 NOTE — Assessment & Plan Note (Addendum)
Leukocytosis with neutrophilia/lymphocytosis/monocytosis since at least 2017 with preservation of RBC/HGB/HCT and platelet count in the setting of ongoing tobacco abuse.  Peripheral work-up is negative including flow cytometry and BCR/ABL.  Labs today: CBC diff, CMET, pathology smear review.  I personally reviewed and went over laboratory results with the patient.  The results are noted within this dictation.  Leukocytosis is improved, likely secondary to ongoing tobacco abuse decrease.  Labs in 9 months: CBC diff, pathology smear review.  Smoking cessation education is provided today.  We've discussed the risks of ongoing tobacco abuse as well as the benefits from cessation.  Return in 9 months for follow-up.  If labs are stable at that time, we can consider releasing the patient back to his primary care provider.  There are no clinical indication or lab reason for bone marrow aspiration and biopsy at this time, particularly with a negative peripheral work-up (FLOW cytometry).

## 2017-01-02 NOTE — Progress Notes (Addendum)
[place], [place] [address]  Leukemoid reaction - Plan: CBC with Differential, Pathologist smear review  CURRENT THERAPY: Observation  INTERVAL HISTORY: Bruce Nichols 31 y.o. male returns for followup of leukocytosis in the setting of ongoing tobacco abuse.  Peripheral work-up is negative including flow cytometry and BCR/ABL.  HPI Elements Leukocytosis  Location: Blood  Quality: Neutrophilia/lymphocytosis/monocytosis  Severity: Moderate  Duration: At least since Jan 2017  Context: Normal HGB/HCT/RBC and platelet count.    Timing:   Modifying Factors: Ongoing tobacco abuse  Associated Signs & Symptoms: Asymptomatic   He continues to smoke 2-3 cigarettes per day.  He significantly decreased his tobacco abuse.  Is encouraged to pursue complete cessation in the future.  He denies any new lumps or bumps.  He denies any B symptoms.  Review of Systems  Constitutional: Negative.  Negative for chills, fever and weight loss.  HENT: Negative.   Eyes: Negative.   Respiratory: Negative.  Negative for cough.   Cardiovascular: Negative.  Negative for chest pain.  Gastrointestinal: Negative.  Negative for blood in stool, constipation, diarrhea, melena, nausea and vomiting.  Genitourinary: Negative.   Musculoskeletal: Negative.   Skin: Negative.   Neurological: Negative.  Negative for weakness.  Endo/Heme/Allergies: Negative.   Psychiatric/Behavioral: Negative.     Past Medical History:  Diagnosis Date  . ADD (attention deficit disorder)   . Asthma   . Scoliosis     Past Surgical History:  Procedure Laterality Date  . MOLE REMOVAL     right side of face    History reviewed. No pertinent family history.  Social History   Social History  . Marital status: Single    Spouse name: N/A  . Number of children: N/A  . Years of education: N/A   Social History Main Topics  . Smoking status: Current Every Day Smoker    Packs/day:  1.00    Types: Cigarettes  . Smokeless tobacco: Never Used  . Alcohol use Yes     Comment: occasionally  . Drug use: Yes    Types: Marijuana  . Sexual activity: Not Asked   Other Topics Concern  . None   Social History Narrative  . None     PHYSICAL EXAMINATION  ECOG PERFORMANCE STATUS: 0 - Asymptomatic  Vitals:   01/02/17 1408  BP: 134/90  Pulse: 60  Resp: 16  Temp: 97.7 F (36.5 C)    GENERAL:alert, no distress, well nourished, well developed, comfortable, cooperative, obese, smiling and accompanied by brother and mother. SKIN: skin color, texture, turgor are normal, no rashes or significant lesions HEAD: Normocephalic, No masses, lesions, tenderness or abnormalities EYES: normal, EOMI, Conjunctiva are pink and non-injected EARS: External ears normal OROPHARYNX:lips, buccal mucosa, and tongue normal and mucous membranes are moist  NECK: supple, no adenopathy, trachea midline LYMPH:  no palpable lymphadenopathy, no hepatosplenomegaly BREAST:not examined LUNGS: clear to auscultation  HEART: regular rate & rhythm, no murmurs, no gallops, S1 normal and S2 normal ABDOMEN:abdomen soft, non-tender, obese and normal bowel sounds BACK: Back symmetric, no curvature. EXTREMITIES:less then 2 second capillary refill, no joint deformities, effusion, or inflammation, no skin discoloration, no cyanosis  NEURO: alert & oriented x 3 with fluent speech, no focal motor/sensory deficits, gait normal   LABORATORY DATA: CBC    Component Value Date/Time   WBC 11.7 (H) 01/02/2017 1308   RBC 5.28 01/02/2017 1308   HGB 15.0 01/02/2017 1308   HCT 45.2 01/02/2017 1308  PLT 243 01/02/2017 1308   MCV 85.6 01/02/2017 1308   MCH 28.4 01/02/2017 1308   MCHC 33.2 01/02/2017 1308   RDW 14.0 01/02/2017 1308   LYMPHSABS 4.3 (H) 01/02/2017 1308   MONOABS 1.0 01/02/2017 1308   EOSABS 0.2 01/02/2017 1308   BASOSABS 0.1 01/02/2017 1308      Chemistry      Component Value Date/Time   NA  138 01/02/2017 1308   K 3.9 01/02/2017 1308   CL 107 01/02/2017 1308   CO2 24 01/02/2017 1308   BUN 10 01/02/2017 1308   CREATININE 0.83 01/02/2017 1308      Component Value Date/Time   CALCIUM 8.3 (L) 01/02/2017 1308   ALKPHOS 59 01/02/2017 1308   AST 42 (H) 01/02/2017 1308   ALT 74 (H) 01/02/2017 1308   BILITOT 0.6 01/02/2017 1308        PENDING LABS:   RADIOGRAPHIC STUDIES:  No results found.   PATHOLOGY:    ASSESSMENT AND PLAN:  Leukocytosis Leukocytosis with neutrophilia/lymphocytosis/monocytosis since at least 2017 with preservation of RBC/HGB/HCT and platelet count in the setting of ongoing tobacco abuse.  Peripheral work-up is negative including flow cytometry and BCR/ABL.  Labs today: CBC diff, CMET, pathology smear review.  I personally reviewed and went over laboratory results with the patient.  The results are noted within this dictation.  Leukocytosis is improved, likely secondary to ongoing tobacco abuse decrease.  Labs in 9 months: CBC diff, pathology smear review.  Smoking cessation education is provided today.  We've discussed the risks of ongoing tobacco abuse as well as the benefits from cessation.  Return in 9 months for follow-up.  If labs are stable at that time, we can consider releasing the patient back to his primary care provider.  There are no clinical indication or lab reason for bone marrow aspiration and biopsy at this time, particularly with a negative peripheral work-up (FLOW cytometry).   Number of Diagnoses or Treatment Options- Section A:      Problems to Exam Physician Problem(s) Number x Points= Results  Self-limited or minor (stable, improved, or worsening)  Max=2  1 1   Est. Problem (to examiner); stable, improved   1 1  Est. Problem (to examiner); worsening   2   New problem (to examiner); no additional work-up planned  Max=1 3   New problem (to examiner); add. work-up planned   4      Total: 2    Amount and/or Complexity  of Data to be Reviewed- Section B    Data to be reviewed: Points    Review and/or order of clinical lab tests 1  [x]    Review and/or order of tests in the radiology section of CPT (includes nuclear med & other except cardiac cath & ECG) 1  []    Review and/or order of tests in the medicine section of CPT (e.g. EKG, cardiac cath, non-invasive vascular studies, pulmonary function studies) 1  []    Discussion of test results with performing physician 1  []    Decision to obtain old records and/or obtaining history from someone other than patient 1  []    Review and summarization of old records and/or obtaining history from someone other than patient and/or discussion of case with another health care provider 2  [x]    Independent visualization of image, tracing, or specimen itself (not simply review report) 2  []    Total:   3    Risk of  complications and/or Morbidity or Mortality- Section C  Level of Risk: Presenting Problem(s) Diagnostic Procedure(s) Ordered Management Options Selected  Minimal One self-limited or minor problem (eg cold, insect bite, tinea corporis)  Lab test requiring venipuncture  Chest xray  EKG/EEG  [place] or Echo  KOH prep  Urinalysis  Rest  Gargles  Elastic bandages  Superficial dressings  Low  Two or more self-limited or minor problems  One stable chronic illness (well-controlled HTN, non-insulin dependent diabetes, cataract, BPH)  Acute uncomplicated illness or injury (cystitis, allergic rhinitis, simple sprain)  Physiologic test not under stress (pulm fnx tests)  Non-cardiovascular imaging studies with contrast (barium enema)  Superficial needle biopsy  Clinical laboratory tests requiring arterial puncture  Skin biopsies  OTC drugs  Minor surgery with no identified risk factors  Physical therapy  Occupational therapy  IV fluids without additives  Moderate  One or more chronic illnesses with mild exacerbation, progression, or side effects of  treatment  Two or more stable chronic illnesses  Undiagnosed new problem with uncertain prognosis (lump in breast)  Acute illness with systemic symptoms (pyelonephritis, pneumonitis, colitis)  Acute complicated injury (head injury with brief loss of consciousness)  Physiologic test under stress (cardiac stress test, fetal contraction stress test)  Diagnostic endoscopies with no identified risk factors  Deep needle or incisional biopsy  Cardiovascular imaging studies with contrast and no identified risk factors (arteriogram, cardiac cath)  Obtain fluid from body cavity (LP, thoracentesis, culdocentesis)  Minor surgery with identified risk factors  Elective surgery (open, percutaneous, or endoscopic) with no identified risk factors  Prescription drug management  Therapeutic nuclear medicine  IV fluids with additives  Closed treatment of fracture of dislocation without manipulation  High  One or more chronic illnesses with severe exacerbation, progression, or side effects of treatment.  Acute or chronic illnesses or injuries that may pose a threat to life or bodily function (multiple trauma, acute MI, PE, severe respiratory distress, progressive severe rheumatoid arthritis, psychiatric illness with potential threat to self or others, peritonitis, acute renal failure)  An abrupt change in neurological status (seizure, TIA, weakness, sensory loss)  Cardiovascular imaging studies with contrast with identified risk factors  Cardiac electrophysiological tests  Diagnostic endoscopies with identified risk factors  Discography   Elective major surgery (open, percutaneous, or endoscopic) with identified risk factor  Emergency major surgery (open, percutaneous, or endoscopic)  Parental controlled substances  Drug therapy requiring intensive monitoring for toxicity  Decision not to resuscitate or deescalate care because of poor prognosis     Final Result of Complexity        Choose decision making level with 2 or 3 checks OR choose the decision making level on Section B       A Number of diagnoses or treatment options  []   </= 1 Minimal  [x]   2 Limited  []   3 Multiple  []   >/= 4 Extensive  B Amount and complexity of data  []   </= 1 Minimal or low  []   2 Limited  [x]   3  Moderate  []   >/= 4 Extensive  C Highest risk  []   Minimal  []   Low  [x]   Moderate  []   High   Type of decision making  []   Straight-forward  []   Low Complexity  [x]   Moderate- Complexity  []   High- Complexity     ORDERS PLACED FOR THIS ENCOUNTER: Orders Placed This Encounter  Procedures  . CBC with Differential  . Pathologist smear review    MEDICATIONS PRESCRIBED THIS ENCOUNTER:  No orders of the defined types were placed in this encounter.   THERAPY PLAN:  Will continue to monitor WBC count.  All questions were answered. The patient knows to call the clinic with any problems, questions or concerns. We can certainly see the patient much sooner if necessary.  Patient and plan discussed with Dr. Twana First and she is in agreement with the aforementioned.   This note is electronically signed by: Doy Mince 01/02/2017 3:05 PM

## 2017-01-02 NOTE — Patient Instructions (Signed)
[facility] at [facility] Discharge Instructions  RECOMMENDATIONS MADE BY THE CONSULTANT AND ANY TEST RESULTS WILL BE SENT TO YOUR REFERRING PHYSICIAN.  You were seen today by Tom Kefalas PA-C. Return in 9 months for labs and follow up.    Thank you for choosing  Cancer Center at [facility] to provide your oncology and hematology care.  To afford each patient quality time with our provider, please arrive at least 15 minutes before your scheduled appointment time.    If you have a lab appointment with the Cancer Center please come in thru the  Main Entrance and check in at the main information desk  You need to re-schedule your appointment should you arrive 10 or more minutes late.  We strive to give you quality time with our providers, and arriving late affects you and other patients whose appointments are after yours.  Also, if you no show three or more times for appointments you may be dismissed from the clinic at the providers discretion.     Again, thank you for choosing [facility].  Our hope is that these requests will decrease the amount of time that you wait before being seen by our physicians.       _____________________________________________________________  Should you have questions after your visit to [facility], please contact our office at [phone] between the hours of 8:30 a.m. and 4:30 p.m.  Voicemails left after 4:30 p.m. will not be returned until the following business day.  For prescription refill requests, have your pharmacy contact our office.       Resources For Cancer Patients and their Caregivers ? American Cancer Society: Can assist with transportation, wigs, general needs, runs Look Good Feel Better.        1-888-227-6333 ? Cancer Care: Provides financial assistance, online support groups, medication/co-pay assistance.  1-800-813-HOPE (4673) ? [facility] Assists [place] cancer patients and their families through emotional , educational and financial support.  336-427-4357 ? [place] DSS Where to apply for food stamps, Medicaid and utility assistance. 336-342-1394 ? [facility]: Transportation to medical appointments. 336-347-2287 ? Social Security Administration: May apply for disability if have a Stage IV cancer. 336-342-7796 1-800-772-1213 ? [place] Aging, Disability and Transit Services: Assists with nutrition, care and transit needs. 336-349-2343  Cancer Center Support Programs: @[email or web address]@ > Cancer Support Group  2nd Tuesday of the month 1pm-2pm, Journey Room  > Creative Journey  3rd Tuesday of the month 1130am-1pm, Journey Room  > Look Good Feel Better  1st Wednesday of the month 10am-12 noon, Journey Room (Call American Cancer Society to register 1-800-395-5775)    

## 2017-01-03 ENCOUNTER — Telehealth (HOSPITAL_COMMUNITY): Payer: Self-pay | Admitting: *Deleted

## 2017-01-03 NOTE — Telephone Encounter (Signed)
See telephone encounter.

## 2017-01-22 DIAGNOSIS — E6609 Other obesity due to excess calories: Secondary | ICD-10-CM | POA: Diagnosis not present

## 2017-01-22 DIAGNOSIS — J209 Acute bronchitis, unspecified: Secondary | ICD-10-CM | POA: Diagnosis not present

## 2017-01-22 DIAGNOSIS — E669 Obesity, unspecified: Secondary | ICD-10-CM | POA: Diagnosis not present

## 2017-01-22 DIAGNOSIS — R0602 Shortness of breath: Secondary | ICD-10-CM | POA: Diagnosis not present

## 2017-01-22 DIAGNOSIS — J45909 Unspecified asthma, uncomplicated: Secondary | ICD-10-CM | POA: Diagnosis not present

## 2017-01-22 DIAGNOSIS — Z6831 Body mass index (BMI) 31.0-31.9, adult: Secondary | ICD-10-CM | POA: Diagnosis not present

## 2017-01-22 DIAGNOSIS — J343 Hypertrophy of nasal turbinates: Secondary | ICD-10-CM | POA: Diagnosis not present

## 2017-01-22 DIAGNOSIS — J069 Acute upper respiratory infection, unspecified: Secondary | ICD-10-CM | POA: Diagnosis not present

## 2017-01-22 DIAGNOSIS — R062 Wheezing: Secondary | ICD-10-CM | POA: Diagnosis not present

## 2017-01-22 DIAGNOSIS — K219 Gastro-esophageal reflux disease without esophagitis: Secondary | ICD-10-CM | POA: Diagnosis not present

## 2017-10-05 ENCOUNTER — Other Ambulatory Visit (HOSPITAL_COMMUNITY): Payer: Self-pay | Admitting: *Deleted

## 2017-10-05 DIAGNOSIS — D72829 Elevated white blood cell count, unspecified: Secondary | ICD-10-CM

## 2017-10-08 ENCOUNTER — Ambulatory Visit (HOSPITAL_COMMUNITY): Payer: Self-pay | Admitting: Oncology

## 2017-10-08 ENCOUNTER — Other Ambulatory Visit (HOSPITAL_COMMUNITY): Payer: Self-pay

## 2017-10-22 DIAGNOSIS — J209 Acute bronchitis, unspecified: Secondary | ICD-10-CM | POA: Diagnosis not present

## 2017-10-22 DIAGNOSIS — Z1389 Encounter for screening for other disorder: Secondary | ICD-10-CM | POA: Diagnosis not present

## 2017-12-05 DIAGNOSIS — J9801 Acute bronchospasm: Secondary | ICD-10-CM | POA: Diagnosis not present

## 2017-12-05 DIAGNOSIS — J418 Mixed simple and mucopurulent chronic bronchitis: Secondary | ICD-10-CM | POA: Diagnosis not present

## 2017-12-05 DIAGNOSIS — Z0001 Encounter for general adult medical examination with abnormal findings: Secondary | ICD-10-CM | POA: Diagnosis not present

## 2017-12-05 DIAGNOSIS — J301 Allergic rhinitis due to pollen: Secondary | ICD-10-CM | POA: Diagnosis not present

## 2017-12-05 DIAGNOSIS — E785 Hyperlipidemia, unspecified: Secondary | ICD-10-CM | POA: Diagnosis not present

## 2017-12-05 DIAGNOSIS — D72829 Elevated white blood cell count, unspecified: Secondary | ICD-10-CM | POA: Diagnosis not present

## 2017-12-05 DIAGNOSIS — Z6829 Body mass index (BMI) 29.0-29.9, adult: Secondary | ICD-10-CM | POA: Diagnosis not present

## 2017-12-05 DIAGNOSIS — E663 Overweight: Secondary | ICD-10-CM | POA: Diagnosis not present

## 2017-12-05 DIAGNOSIS — R946 Abnormal results of thyroid function studies: Secondary | ICD-10-CM | POA: Diagnosis not present

## 2017-12-05 DIAGNOSIS — Z1389 Encounter for screening for other disorder: Secondary | ICD-10-CM | POA: Diagnosis not present

## 2018-07-16 DIAGNOSIS — F909 Attention-deficit hyperactivity disorder, unspecified type: Secondary | ICD-10-CM | POA: Diagnosis not present

## 2018-07-16 DIAGNOSIS — R05 Cough: Secondary | ICD-10-CM | POA: Diagnosis not present

## 2018-07-16 DIAGNOSIS — R1110 Vomiting, unspecified: Secondary | ICD-10-CM | POA: Diagnosis not present

## 2018-07-16 DIAGNOSIS — Z6829 Body mass index (BMI) 29.0-29.9, adult: Secondary | ICD-10-CM | POA: Diagnosis not present

## 2018-07-16 DIAGNOSIS — Z1389 Encounter for screening for other disorder: Secondary | ICD-10-CM | POA: Diagnosis not present
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
## 2019-04-04 DIAGNOSIS — Z0001 Encounter for general adult medical examination with abnormal findings: Secondary | ICD-10-CM | POA: Diagnosis not present

## 2019-04-04 DIAGNOSIS — Z1389 Encounter for screening for other disorder: Secondary | ICD-10-CM | POA: Diagnosis not present

## 2019-04-04 DIAGNOSIS — F909 Attention-deficit hyperactivity disorder, unspecified type: Secondary | ICD-10-CM | POA: Diagnosis not present

## 2019-04-04 DIAGNOSIS — K219 Gastro-esophageal reflux disease without esophagitis: Secondary | ICD-10-CM | POA: Diagnosis not present

## 2019-04-04 DIAGNOSIS — Z6831 Body mass index (BMI) 31.0-31.9, adult: Secondary | ICD-10-CM | POA: Diagnosis not present

## 2019-04-04 DIAGNOSIS — E6609 Other obesity due to excess calories: Secondary | ICD-10-CM | POA: Diagnosis not present

## 2019-07-11 DIAGNOSIS — M545 Low back pain: Secondary | ICD-10-CM | POA: Diagnosis not present

## 2019-07-11 DIAGNOSIS — Z6830 Body mass index (BMI) 30.0-30.9, adult: Secondary | ICD-10-CM | POA: Diagnosis not present

## 2019-07-11 DIAGNOSIS — K769 Liver disease, unspecified: Secondary | ICD-10-CM | POA: Diagnosis not present

## 2019-07-11 DIAGNOSIS — F900 Attention-deficit hyperactivity disorder, predominantly inattentive type: Secondary | ICD-10-CM | POA: Diagnosis not present

## 2019-07-11 DIAGNOSIS — R945 Abnormal results of liver function studies: Secondary | ICD-10-CM | POA: Diagnosis not present

## 2019-07-11 DIAGNOSIS — F70 Mild intellectual disabilities: Secondary | ICD-10-CM | POA: Diagnosis not present

## 2019-08-27 ENCOUNTER — Telehealth: Payer: Self-pay | Admitting: *Deleted

## 2019-08-27 ENCOUNTER — Ambulatory Visit: Payer: Medicare Other | Attending: Internal Medicine

## 2019-08-27 ENCOUNTER — Other Ambulatory Visit: Payer: Self-pay

## 2019-08-27 ENCOUNTER — Other Ambulatory Visit: Payer: Medicare Other

## 2019-08-27 DIAGNOSIS — Z20822 Contact with and (suspected) exposure to covid-19: Secondary | ICD-10-CM

## 2019-08-27 NOTE — Telephone Encounter (Signed)
He called in requesting his COVID-19 test result.   He just got tested today.   I let him know the result would be back in 24-48 hours.

## 2019-08-28 LAB — NOVEL CORONAVIRUS, NAA: SARS-CoV-2, NAA: NOT DETECTED

## 2021-01-06 DIAGNOSIS — F900 Attention-deficit hyperactivity disorder, predominantly inattentive type: Secondary | ICD-10-CM | POA: Diagnosis not present

## 2021-01-06 DIAGNOSIS — J4520 Mild intermittent asthma, uncomplicated: Secondary | ICD-10-CM | POA: Diagnosis not present

## 2021-01-06 DIAGNOSIS — Z0000 Encounter for general adult medical examination without abnormal findings: Secondary | ICD-10-CM | POA: Diagnosis not present

## 2021-01-06 DIAGNOSIS — R945 Abnormal results of liver function studies: Secondary | ICD-10-CM | POA: Diagnosis not present

## 2021-01-06 DIAGNOSIS — F70 Mild intellectual disabilities: Secondary | ICD-10-CM | POA: Diagnosis not present

## 2021-01-06 DIAGNOSIS — Z1331 Encounter for screening for depression: Secondary | ICD-10-CM | POA: Diagnosis not present

## 2021-01-06 DIAGNOSIS — K219 Gastro-esophageal reflux disease without esophagitis: Secondary | ICD-10-CM | POA: Diagnosis not present

## 2021-01-06 DIAGNOSIS — E6609 Other obesity due to excess calories: Secondary | ICD-10-CM | POA: Diagnosis not present

## 2021-01-06 DIAGNOSIS — Z6833 Body mass index (BMI) 33.0-33.9, adult: Secondary | ICD-10-CM | POA: Diagnosis not present

## 2021-04-08 DIAGNOSIS — E6609 Other obesity due to excess calories: Secondary | ICD-10-CM | POA: Diagnosis not present

## 2021-04-08 DIAGNOSIS — F70 Mild intellectual disabilities: Secondary | ICD-10-CM | POA: Diagnosis not present

## 2021-04-08 DIAGNOSIS — Z79899 Other long term (current) drug therapy: Secondary | ICD-10-CM | POA: Diagnosis not present

## 2021-04-08 DIAGNOSIS — E782 Mixed hyperlipidemia: Secondary | ICD-10-CM | POA: Diagnosis not present

## 2021-04-08 DIAGNOSIS — F900 Attention-deficit hyperactivity disorder, predominantly inattentive type: Secondary | ICD-10-CM | POA: Diagnosis not present

## 2021-04-08 DIAGNOSIS — Z6832 Body mass index (BMI) 32.0-32.9, adult: Secondary | ICD-10-CM | POA: Diagnosis not present

## 2021-04-08 DIAGNOSIS — Z1389 Encounter for screening for other disorder: Secondary | ICD-10-CM | POA: Diagnosis not present

## 2021-04-08 DIAGNOSIS — K219 Gastro-esophageal reflux disease without esophagitis: Secondary | ICD-10-CM | POA: Diagnosis not present

## 2021-04-08 DIAGNOSIS — J4520 Mild intermittent asthma, uncomplicated: Secondary | ICD-10-CM | POA: Diagnosis not present

## 2021-04-08 DIAGNOSIS — Z0000 Encounter for general adult medical examination without abnormal findings: Secondary | ICD-10-CM | POA: Diagnosis not present

## 2021-07-01 DIAGNOSIS — Z6832 Body mass index (BMI) 32.0-32.9, adult: Secondary | ICD-10-CM | POA: Diagnosis not present

## 2021-07-01 DIAGNOSIS — F70 Mild intellectual disabilities: Secondary | ICD-10-CM | POA: Diagnosis not present

## 2021-07-01 DIAGNOSIS — J4520 Mild intermittent asthma, uncomplicated: Secondary | ICD-10-CM | POA: Diagnosis not present

## 2021-07-01 DIAGNOSIS — F900 Attention-deficit hyperactivity disorder, predominantly inattentive type: Secondary | ICD-10-CM | POA: Diagnosis not present

## 2021-07-01 DIAGNOSIS — E6609 Other obesity due to excess calories: Secondary | ICD-10-CM | POA: Diagnosis not present

## 2021-08-18 DIAGNOSIS — H6092 Unspecified otitis externa, left ear: Secondary | ICD-10-CM | POA: Diagnosis not present

## 2021-08-18 DIAGNOSIS — H6012 Cellulitis of left external ear: Secondary | ICD-10-CM | POA: Diagnosis not present

## 2021-10-06 DIAGNOSIS — F900 Attention-deficit hyperactivity disorder, predominantly inattentive type: Secondary | ICD-10-CM | POA: Diagnosis not present

## 2021-10-06 DIAGNOSIS — Z6832 Body mass index (BMI) 32.0-32.9, adult: Secondary | ICD-10-CM | POA: Diagnosis not present

## 2021-10-06 DIAGNOSIS — F70 Mild intellectual disabilities: Secondary | ICD-10-CM | POA: Diagnosis not present

## 2021-10-06 DIAGNOSIS — E6609 Other obesity due to excess calories: Secondary | ICD-10-CM | POA: Diagnosis not present

## 2021-12-22 DIAGNOSIS — F900 Attention-deficit hyperactivity disorder, predominantly inattentive type: Secondary | ICD-10-CM | POA: Diagnosis not present

## 2021-12-22 DIAGNOSIS — Z6833 Body mass index (BMI) 33.0-33.9, adult: Secondary | ICD-10-CM | POA: Diagnosis not present

## 2021-12-22 DIAGNOSIS — E6609 Other obesity due to excess calories: Secondary | ICD-10-CM | POA: Diagnosis not present

## 2022-03-31 DIAGNOSIS — F70 Mild intellectual disabilities: Secondary | ICD-10-CM | POA: Diagnosis not present

## 2022-03-31 DIAGNOSIS — Z6832 Body mass index (BMI) 32.0-32.9, adult: Secondary | ICD-10-CM | POA: Diagnosis not present

## 2022-03-31 DIAGNOSIS — F900 Attention-deficit hyperactivity disorder, predominantly inattentive type: Secondary | ICD-10-CM | POA: Diagnosis not present

## 2022-03-31 DIAGNOSIS — R10811 Right upper quadrant abdominal tenderness: Secondary | ICD-10-CM | POA: Diagnosis not present

## 2022-05-05 DIAGNOSIS — Z23 Encounter for immunization: Secondary | ICD-10-CM | POA: Diagnosis not present

## 2022-05-05 DIAGNOSIS — Z1331 Encounter for screening for depression: Secondary | ICD-10-CM | POA: Diagnosis not present

## 2022-05-05 DIAGNOSIS — K219 Gastro-esophageal reflux disease without esophagitis: Secondary | ICD-10-CM | POA: Diagnosis not present

## 2022-05-05 DIAGNOSIS — R945 Abnormal results of liver function studies: Secondary | ICD-10-CM | POA: Diagnosis not present

## 2022-05-05 DIAGNOSIS — E6609 Other obesity due to excess calories: Secondary | ICD-10-CM | POA: Diagnosis not present

## 2022-05-05 DIAGNOSIS — Z6832 Body mass index (BMI) 32.0-32.9, adult: Secondary | ICD-10-CM | POA: Diagnosis not present

## 2022-05-05 DIAGNOSIS — Z0001 Encounter for general adult medical examination with abnormal findings: Secondary | ICD-10-CM | POA: Diagnosis not present

## 2022-05-05 DIAGNOSIS — J4520 Mild intermittent asthma, uncomplicated: Secondary | ICD-10-CM | POA: Diagnosis not present

## 2022-05-05 DIAGNOSIS — F70 Mild intellectual disabilities: Secondary | ICD-10-CM | POA: Diagnosis not present

## 2022-05-05 DIAGNOSIS — F900 Attention-deficit hyperactivity disorder, predominantly inattentive type: Secondary | ICD-10-CM | POA: Diagnosis not present

## 2022-05-05 DIAGNOSIS — E782 Mixed hyperlipidemia: Secondary | ICD-10-CM | POA: Diagnosis not present

## 2022-07-28 DIAGNOSIS — Z6833 Body mass index (BMI) 33.0-33.9, adult: Secondary | ICD-10-CM | POA: Diagnosis not present

## 2022-07-28 DIAGNOSIS — E6609 Other obesity due to excess calories: Secondary | ICD-10-CM | POA: Diagnosis not present

## 2022-07-28 DIAGNOSIS — F900 Attention-deficit hyperactivity disorder, predominantly inattentive type: Secondary | ICD-10-CM | POA: Diagnosis not present

## 2022-07-28 DIAGNOSIS — F70 Mild intellectual disabilities: Secondary | ICD-10-CM | POA: Diagnosis not present

## 2022-10-16 DIAGNOSIS — J069 Acute upper respiratory infection, unspecified: Secondary | ICD-10-CM | POA: Diagnosis not present

## 2022-11-03 DIAGNOSIS — E782 Mixed hyperlipidemia: Secondary | ICD-10-CM | POA: Diagnosis not present

## 2022-11-03 DIAGNOSIS — J069 Acute upper respiratory infection, unspecified: Secondary | ICD-10-CM | POA: Diagnosis not present

## 2022-11-03 DIAGNOSIS — J4520 Mild intermittent asthma, uncomplicated: Secondary | ICD-10-CM | POA: Diagnosis not present

## 2022-11-03 DIAGNOSIS — K219 Gastro-esophageal reflux disease without esophagitis: Secondary | ICD-10-CM | POA: Diagnosis not present

## 2023-02-09 DIAGNOSIS — M4125 Other idiopathic scoliosis, thoracolumbar region: Secondary | ICD-10-CM | POA: Diagnosis not present

## 2023-02-09 DIAGNOSIS — J4520 Mild intermittent asthma, uncomplicated: Secondary | ICD-10-CM | POA: Diagnosis not present

## 2023-04-10 DIAGNOSIS — M4125 Other idiopathic scoliosis, thoracolumbar region: Secondary | ICD-10-CM | POA: Diagnosis not present

## 2023-04-10 DIAGNOSIS — E782 Mixed hyperlipidemia: Secondary | ICD-10-CM | POA: Diagnosis not present

## 2023-04-10 DIAGNOSIS — J4520 Mild intermittent asthma, uncomplicated: Secondary | ICD-10-CM | POA: Diagnosis not present

## 2023-05-11 DIAGNOSIS — E7849 Other hyperlipidemia: Secondary | ICD-10-CM | POA: Diagnosis not present

## 2023-05-11 DIAGNOSIS — Z0001 Encounter for general adult medical examination with abnormal findings: Secondary | ICD-10-CM | POA: Diagnosis not present

## 2023-05-11 DIAGNOSIS — M4125 Other idiopathic scoliosis, thoracolumbar region: Secondary | ICD-10-CM | POA: Diagnosis not present

## 2023-05-11 DIAGNOSIS — K219 Gastro-esophageal reflux disease without esophagitis: Secondary | ICD-10-CM | POA: Diagnosis not present

## 2023-05-11 DIAGNOSIS — E782 Mixed hyperlipidemia: Secondary | ICD-10-CM | POA: Diagnosis not present

## 2023-05-11 DIAGNOSIS — R945 Abnormal results of liver function studies: Secondary | ICD-10-CM | POA: Diagnosis not present

## 2023-05-12 LAB — LAB REPORT - SCANNED: EGFR: 116

## 2023-07-10 DIAGNOSIS — E7849 Other hyperlipidemia: Secondary | ICD-10-CM | POA: Diagnosis not present

## 2023-07-10 DIAGNOSIS — J4520 Mild intermittent asthma, uncomplicated: Secondary | ICD-10-CM | POA: Diagnosis not present

## 2023-07-12 ENCOUNTER — Encounter: Payer: Self-pay | Admitting: Nutrition

## 2023-07-12 ENCOUNTER — Encounter: Payer: Medicare Other | Attending: Family Medicine | Admitting: Nutrition

## 2023-07-12 VITALS — Ht 67.0 in | Wt 217.0 lb

## 2023-07-12 DIAGNOSIS — E669 Obesity, unspecified: Secondary | ICD-10-CM | POA: Insufficient documentation

## 2023-07-12 DIAGNOSIS — E782 Mixed hyperlipidemia: Secondary | ICD-10-CM | POA: Insufficient documentation

## 2023-07-12 NOTE — Patient Instructions (Signed)
Goals  Eat three meals per day Work on going to bed 11 pm and get up at 8 am Cut out junk food and processed foods Drink only water-5 bottles per daily. Walk 30 minutes 1-2 lb per week.

## 2023-07-12 NOTE — Progress Notes (Signed)
Medical Nutrition Therapy  Appointment Start time:  0800  Appointment End time:  0930  Primary concerns today: Overweight  Referral diagnosis: E66.9 Preferred learning style: No preference  Learning readiness: Ready   NUTRITION ASSESSMENT  37 yr old male referred for obesity and possible pre DM. Here with his mom. He is on cholesterol medication. Has ADHD. He and his mom eat a lot of processed foods/junk food and fast foods. Not getting any exercise.  He is very motivated to work on eating healthier, exercise and lose weight to prevent DM and other health issues.  Clinical Medical Hx:  Past Medical History:  Diagnosis Date   ADD (attention deficit disorder)    Asthma    Scoliosis     Medications:  Current Outpatient Medications on File Prior to Visit  Medication Sig Dispense Refill   albuterol (PROVENTIL HFA;VENTOLIN HFA) 108 (90 Base) MCG/ACT inhaler Inhale 1 puff into the lungs every 6 (six) hours as needed for wheezing or shortness of breath.     amphetamine-dextroamphetamine (ADDERALL) 20 MG tablet Take 20 mg by mouth daily.     HYDROcodone-acetaminophen (NORCO/VICODIN) 5-325 MG tablet Take one-two tabs po q 4-6 hrs prn pain 10 tablet 0   No current facility-administered medications on file prior to visit.    Labs: No results found for: "HGBA1C"   Notable Signs/Symptoms: None  Lifestyle & Dietary Hx LIves with his mom  Estimated daily fluid intake: 30 oz Supplements:  Sleep: poor stays up late and sleeps in Stress / self-care:  Current average weekly physical activity: ADL  24-Hr Dietary Recall First Meal:  9-10 Ghost cereal with whole milk, 1-2 cups,  Snack:  Second Meal: 2-3 Steak,  mac/cheese, 1/2c,  Cheerwine Snack: Chips Third Meal:  7 pm Same as lunch, cheerwine. Snack: cheetos Beverages: cheerwine.  Estimated Energy Needs Calories: 1600 Carbohydrate: 180g Protein: 120g Fat: 44g   NUTRITION DIAGNOSIS  NI-1.7 Predicted excessive energy intake  As related to High calorie high processed diet.  As evidenced by BMI 33.9 and hyperlipidemia.Marland Kitchen   NUTRITION INTERVENTION  Nutrition education (E-1) on the following topics:  Lifestyle Medicine  - Whole Food, Plant Predominant Nutrition is highly recommended: Eat Plenty of vegetables, Mushrooms, fruits, Legumes, Whole Grains, Nuts, seeds in lieu of processed meats, processed snacks/pastries red meat, poultry, eggs.    -It is better to avoid simple carbohydrates including: Cakes, Sweet Desserts, Ice Cream, Soda (diet and regular), Sweet Tea, Candies, Chips, Cookies, Store Bought Juices, Alcohol in Excess of  1-2 drinks a day, Lemonade,  Artificial Sweeteners, Doughnuts, Coffee Creamers, "Sugar-free" Products, etc, etc.  This is not a complete list.....  Exercise: If you are able: 30 -60 minutes a day ,4 days a week, or 150 minutes a week.  The longer the better.  Combine stretch, strength, and aerobic activities.  If you were told in the past that you have high risk for cardiovascular diseases, you may seek evaluation by your heart doctor prior to initiating moderate to intense exercise programs.    Handouts Provided Include  LIfestyle Medicine handouts  Learning Style & Readiness for Change Teaching method utilized: Visual & Auditory  Demonstrated degree of understanding via: Teach Back  Barriers to learning/adherence to lifestyle change: none  Goals Established by Pt  Eat three meals per day Work on going to bed 11 pm and get up at 8 am Cut out junk food and processed foods Drink only water-5 bottles per daily. Walk 30 minutes 1-2 lb per week.  MONITORING & EVALUATION Dietary intake, weekly physical activity, and weight in 2-3 months.  Next Steps  Patient is to work on eating more foods from a garden and less processed and convenience foods.Marland Kitchen

## 2023-09-12 ENCOUNTER — Encounter: Payer: 59 | Attending: Family Medicine | Admitting: Nutrition

## 2023-09-12 ENCOUNTER — Encounter: Payer: Self-pay | Admitting: Nutrition

## 2023-09-12 VITALS — Ht 67.0 in | Wt 210.0 lb

## 2023-09-12 DIAGNOSIS — Z6833 Body mass index (BMI) 33.0-33.9, adult: Secondary | ICD-10-CM | POA: Diagnosis not present

## 2023-09-12 DIAGNOSIS — Z713 Dietary counseling and surveillance: Secondary | ICD-10-CM | POA: Diagnosis not present

## 2023-09-12 DIAGNOSIS — E785 Hyperlipidemia, unspecified: Secondary | ICD-10-CM | POA: Insufficient documentation

## 2023-09-12 DIAGNOSIS — E669 Obesity, unspecified: Secondary | ICD-10-CM | POA: Diagnosis present

## 2023-09-12 NOTE — Patient Instructions (Addendum)
Goal Increase more green vegetables 1-2 per day 7 days a week Keep walking 2 miles 5 days a week.

## 2023-09-12 NOTE — Progress Notes (Signed)
Medical Nutrition Therapy  Appointment Start time:  1300  Appointment End time:  1330  Primary concerns today: Overweight  Referral diagnosis: E66.9 Preferred learning style: No preference  Learning readiness: Ready   NUTRITION ASSESSMENT  Changes made: Eating breakfast. He is walking with his mom 1-2 miles daily. Lost 7 lbs. Feels better. Sleeping better. His mom is supportive and helping with making changes with food choices. Cut out a lot starchy foods. Eating wheat bread now. Cut back on fats. Increased fruits and vegetables. His mom has stopped buying junk food. Goals set previously. Eat three meals per day-done Work on going to bed 11 pm and get up at 8 am-work in progress Cut out junk food and processed foods-working on it. Drink only water-5 bottles per daily.-done Walk 30 minutes-done 1-2 lb per week.done. Clinical  Medical Hx:  Past Medical History:  Diagnosis Date   ADD (attention deficit disorder)    Asthma    Scoliosis     Medications:  Current Outpatient Medications on File Prior to Visit  Medication Sig Dispense Refill   albuterol (PROVENTIL HFA;VENTOLIN HFA) 108 (90 Base) MCG/ACT inhaler Inhale 1 puff into the lungs every 6 (six) hours as needed for wheezing or shortness of breath.     amphetamine-dextroamphetamine (ADDERALL) 20 MG tablet Take 20 mg by mouth daily.     HYDROcodone-acetaminophen (NORCO/VICODIN) 5-325 MG tablet Take one-two tabs po q 4-6 hrs prn pain 10 tablet 0   No current facility-administered medications on file prior to visit.    Labs: No results found for: "HGBA1C"   Notable Signs/Symptoms: None  Lifestyle & Dietary Hx LIves with his mom  Estimated daily fluid intake: 30 oz Supplements:  Sleep: poor stays up late and sleeps in Stress / self-care:  Current average weekly physical activity: ADL  24-Hr Dietary Recall B) Apple and toast, or eggs and toast  with 2% milk,  Snack:  Second Meal:  Snack: Third Meal:  Chicken  noodle soup LS;  Snack: cheetos Beverages: cheerwine.  Estimated Energy Needs Calories: 1600 Carbohydrate: 180g Protein: 120g Fat: 44g   NUTRITION DIAGNOSIS  NI-1.7 Predicted excessive energy intake As related to High calorie high processed diet.  As evidenced by BMI 33.9 and hyperlipidemia.Marland Kitchen   NUTRITION INTERVENTION  Nutrition education (E-1) on the following topics:  Lifestyle Medicine  - Whole Food, Plant Predominant Nutrition is highly recommended: Eat Plenty of vegetables, Mushrooms, fruits, Legumes, Whole Grains, Nuts, seeds in lieu of processed meats, processed snacks/pastries red meat, poultry, eggs.    -It is better to avoid simple carbohydrates including: Cakes, Sweet Desserts, Ice Cream, Soda (diet and regular), Sweet Tea, Candies, Chips, Cookies, Store Bought Juices, Alcohol in Excess of  1-2 drinks a day, Lemonade,  Artificial Sweeteners, Doughnuts, Coffee Creamers, "Sugar-free" Products, etc, etc.  This is not a complete list.....  Exercise: If you are able: 30 -60 minutes a day ,4 days a week, or 150 minutes a week.  The longer the better.  Combine stretch, strength, and aerobic activities.  If you were told in the past that you have high risk for cardiovascular diseases, you may seek evaluation by your heart doctor prior to initiating moderate to intense exercise programs.    Handouts Provided Include  LIfestyle Medicine handouts  Learning Style & Readiness for Change Teaching method utilized: Visual & Auditory  Demonstrated degree of understanding via: Teach Back  Barriers to learning/adherence to lifestyle change: none  Goals Established by Pt Goal Increase more green vegetables to  1-2 per day 7 days a week. Keep walking 2 miles 5 days a week.    MONITORING & EVALUATION Dietary intake, weekly physical activity, and weight in 2-3 months.  Next Steps  Patient is to work on eating more foods from a garden and less processed and convenience foods.Marland Kitchen

## 2023-09-28 DIAGNOSIS — E7849 Other hyperlipidemia: Secondary | ICD-10-CM | POA: Diagnosis not present

## 2023-09-28 DIAGNOSIS — J4520 Mild intermittent asthma, uncomplicated: Secondary | ICD-10-CM | POA: Diagnosis not present

## 2023-09-28 DIAGNOSIS — R945 Abnormal results of liver function studies: Secondary | ICD-10-CM | POA: Diagnosis not present

## 2023-12-11 ENCOUNTER — Encounter: Payer: Medicare Other | Attending: Internal Medicine | Admitting: Nutrition

## 2023-12-11 ENCOUNTER — Encounter: Payer: Self-pay | Admitting: Nutrition

## 2023-12-11 VITALS — Ht 67.0 in | Wt 203.0 lb

## 2023-12-11 DIAGNOSIS — E669 Obesity, unspecified: Secondary | ICD-10-CM | POA: Diagnosis present

## 2023-12-11 DIAGNOSIS — Z6831 Body mass index (BMI) 31.0-31.9, adult: Secondary | ICD-10-CM | POA: Insufficient documentation

## 2023-12-11 DIAGNOSIS — Z713 Dietary counseling and surveillance: Secondary | ICD-10-CM | POA: Insufficient documentation

## 2023-12-11 DIAGNOSIS — E782 Mixed hyperlipidemia: Secondary | ICD-10-CM | POA: Insufficient documentation

## 2023-12-11 NOTE — Patient Instructions (Addendum)
 Goals Keep up the great job! Keep exercising. Keep eating fruits and vegetables and foods from garden,

## 2023-12-11 NOTE — Progress Notes (Signed)
 Medical Nutrition Therapy  Appointment Start time:  1100  Appointment End time:  1130  Primary concerns today: Overweight  Referral diagnosis: E66.9 Preferred learning style: No preference  Learning readiness: Ready   NUTRITION ASSESSMENT  Follow up  Here with his  mom. Changes made:  Lost 7 lbs.  Still walking daily. Eating more oatmeal with berries.Eating more vegetables and carrots. Still working on getting sleep schedule better. Feels better and likes eating healthier foods.  Says his ADHD has gotten better too.   Clinical Wt Readings from Last 3 Encounters:  12/11/23 203 lb (92.1 kg)  09/12/23 210 lb (95.3 kg)  07/12/23 217 lb (98.4 kg)   Ht Readings from Last 3 Encounters:  12/11/23 5\' 7"  (1.702 m)  09/12/23 5\' 7"  (1.702 m)  07/12/23 5\' 7"  (1.702 m)   Body mass index is 31.79 kg/m. @BMIFA @ Facility age limit for growth %iles is 20 years. Facility age limit for growth %iles is 20 years.  Medical Hx:  Past Medical History:  Diagnosis Date   ADD (attention deficit disorder)    Asthma    Scoliosis     Medications:  Current Outpatient Medications on File Prior to Visit  Medication Sig Dispense Refill   albuterol (PROVENTIL HFA;VENTOLIN HFA) 108 (90 Base) MCG/ACT inhaler Inhale 1 puff into the lungs every 6 (six) hours as needed for wheezing or shortness of breath.     amphetamine-dextroamphetamine (ADDERALL) 20 MG tablet Take 20 mg by mouth daily.     HYDROcodone -acetaminophen  (NORCO/VICODIN) 5-325 MG tablet Take one-two tabs po q 4-6 hrs prn pain 10 tablet 0   No current facility-administered medications on file prior to visit.    Labs: No results found for: "HGBA1C"   Notable Signs/Symptoms: None  Lifestyle & Dietary Hx LIves with his mom  Estimated daily fluid intake: 30 oz Supplements:  Sleep: poor stays up late and sleeps in Stress / self-care:  Current average weekly physical activity: ADL  24-Hr Dietary Recall B) Oatmeal and berrires.   Snack:  Second Meal: CHicken and vegetables, fruit, water Snack: veggies. Third Meal:  Fish and vegetables, salad, water, unsweet tea. Snack: Beverages: water  Estimated Energy Needs Calories: 1600 Carbohydrate: 180g Protein: 120g Fat: 44g   NUTRITION DIAGNOSIS  NI-1.7 Predicted excessive energy intake As related to High calorie high processed diet.  As evidenced by BMI 33.9 and hyperlipidemia.Bruce Nichols   NUTRITION INTERVENTION  Nutrition education (E-1) on the following topics:  Lifestyle Medicine  - Whole Food, Plant Predominant Nutrition is highly recommended: Eat Plenty of vegetables, Mushrooms, fruits, Legumes, Whole Grains, Nuts, seeds in lieu of processed meats, processed snacks/pastries red meat, poultry, eggs.    -It is better to avoid simple carbohydrates including: Cakes, Sweet Desserts, Ice Cream, Soda (diet and regular), Sweet Tea, Candies, Chips, Cookies, Store Bought Juices, Alcohol in Excess of  1-2 drinks a day, Lemonade,  Artificial Sweeteners, Doughnuts, Coffee Creamers, "Sugar-free" Products, etc, etc.  This is not a complete list.....  Exercise: If you are able: 30 -60 minutes a day ,4 days a week, or 150 minutes a week.  The longer the better.  Combine stretch, strength, and aerobic activities.  If you were told in the past that you have high risk for cardiovascular diseases, you may seek evaluation by your heart doctor prior to initiating moderate to intense exercise programs.    Handouts Provided Include  LIfestyle Medicine handouts  Learning Style & Readiness for Change Teaching method utilized: Visual & Auditory  Demonstrated degree  of understanding via: Teach Back  Barriers to learning/adherence to lifestyle change: none  Goals Established by Pt  Keep up the great job! Keep exercising. Keep eating fruits and vegetables and foods from garden,  MONITORING & EVALUATION Dietary intake, weekly physical activity, and weight in 2-3 months.  Next Steps   Patient is to work on eating more foods from a garden and less processed and convenience foods.Bruce Nichols

## 2024-01-04 DIAGNOSIS — L230 Allergic contact dermatitis due to metals: Secondary | ICD-10-CM | POA: Diagnosis not present

## 2024-02-16 DIAGNOSIS — K029 Dental caries, unspecified: Secondary | ICD-10-CM | POA: Diagnosis not present

## 2024-04-04 DIAGNOSIS — L243 Irritant contact dermatitis due to cosmetics: Secondary | ICD-10-CM | POA: Diagnosis not present

## 2024-04-11 DIAGNOSIS — H6692 Otitis media, unspecified, left ear: Secondary | ICD-10-CM | POA: Diagnosis not present

## 2024-04-18 DIAGNOSIS — H6692 Otitis media, unspecified, left ear: Secondary | ICD-10-CM | POA: Diagnosis not present

## 2024-08-06 ENCOUNTER — Ambulatory Visit: Admitting: Nutrition

## 2024-08-25 ENCOUNTER — Encounter: Payer: Self-pay | Admitting: Nutrition

## 2024-08-25 ENCOUNTER — Encounter: Attending: Internal Medicine | Admitting: Nutrition

## 2024-08-25 VITALS — Ht 67.0 in | Wt 208.0 lb

## 2024-08-25 DIAGNOSIS — E669 Obesity, unspecified: Secondary | ICD-10-CM | POA: Insufficient documentation

## 2024-08-25 NOTE — Patient Instructions (Addendum)
 Goals  Get weight down to 200 lbs by next visit Watch for triggers for emotional eating Increase fruits instead of drinking juice. Increase physical activity-walk 30 minutes 3 times perwek Stand every hour for a few minutes.

## 2024-08-25 NOTE — Progress Notes (Signed)
 Medical Nutrition Therapy  Appointment Start time:  1100  Appointment End time:  1130  Primary concerns today: Overweight  Referral diagnosis: E66.9 Preferred learning style: No preference  Learning readiness: Ready   NUTRITION ASSESSMENT  Follow up  Here with his mom.  Has been working on portion control and hasn't been getting second helpings. Eating more fruit and a few vegetables. Doesn't like a lot of low carb vegetables. Was walking but hasn't since it's been colder. HIs mom has been keeping a food journal. Has been drinking juice with breakfast most mornings. Got an air fryer for Christmas. Has got an ear infection. Going to go to urgent care Gained 5 lbs since last visit.  Clinical  Wt Readings from Last 3 Encounters:  08/25/24 208 lb (94.3 kg)  12/11/23 203 lb (92.1 kg)  09/12/23 210 lb (95.3 kg)   Ht Readings from Last 3 Encounters:  08/25/24 5' 7 (1.702 m)  12/11/23 5' 7 (1.702 m)  09/12/23 5' 7 (1.702 m)   Body mass index is 32.58 kg/m. @BMIFA @ Facility age limit for growth %iles is 20 years. Facility age limit for growth %iles is 20 years.  Medical Hx:  Past Medical History:  Diagnosis Date   ADD (attention deficit disorder)    Asthma    Scoliosis     Medications:  Current Outpatient Medications on File Prior to Visit  Medication Sig Dispense Refill   albuterol (PROVENTIL HFA;VENTOLIN HFA) 108 (90 Base) MCG/ACT inhaler Inhale 1 puff into the lungs every 6 (six) hours as needed for wheezing or shortness of breath.     amphetamine-dextroamphetamine (ADDERALL) 20 MG tablet Take 20 mg by mouth daily.     HYDROcodone -acetaminophen  (NORCO/VICODIN) 5-325 MG tablet Take one-two tabs po q 4-6 hrs prn pain 10 tablet 0   No current facility-administered medications on file prior to visit.    Labs:  LDL high at  111 mg/dl. HDL low at 35 mg/dl.  Notable Signs/Symptoms: None  Lifestyle & Dietary Hx LIves with his mom  Estimated daily fluid intake: 30  oz Supplements:  Sleep: poor stays up late and sleeps in Stress / self-care:  Current average weekly physical activity: ADL  24-Hr Dietary Recall B) Oatmeal and berrires.  Snack:  Second Meal: Chicken and vegetables, fruit, water Snack: veggies. Third Meal:  Fish and vegetables, salad, water, unsweet tea. Snack: Beverages: water  Estimated Energy Needs Calories: 1600 Carbohydrate: 180g Protein: 120g Fat: 44g   NUTRITION DIAGNOSIS  NI-1.7 Predicted excessive energy intake As related to High calorie high processed diet.  As evidenced by BMI 33.9 and hyperlipidemia.SABRA   NUTRITION INTERVENTION  Nutrition education (E-1) on the following topics:  Lifestyle Medicine  - Whole Food, Plant Predominant Nutrition is highly recommended: Eat Plenty of vegetables, Mushrooms, fruits, Legumes, Whole Grains, Nuts, seeds in lieu of processed meats, processed snacks/pastries red meat, poultry, eggs.    -It is better to avoid simple carbohydrates including: Cakes, Sweet Desserts, Ice Cream, Soda (diet and regular), Sweet Tea, Candies, Chips, Cookies, Store Bought Juices, Alcohol in Excess of  1-2 drinks a day, Lemonade,  Artificial Sweeteners, Doughnuts, Coffee Creamers, Sugar-free Products, etc, etc.  This is not a complete list.....  Exercise: If you are able: 30 -60 minutes a day ,4 days a week, or 150 minutes a week.  The longer the better.  Combine stretch, strength, and aerobic activities.  If you were told in the past that you have high risk for cardiovascular diseases, you may seek  evaluation by your heart doctor prior to initiating moderate to intense exercise programs.    Handouts Provided Include  LIfestyle Medicine handouts  Learning Style & Readiness for Change Teaching method utilized: Visual & Auditory  Demonstrated degree of understanding via: Teach Back  Barriers to learning/adherence to lifestyle change: none  Goals Established by Pt  Get weight down to 200 lbs by next  visit Watch for triggers for emotional eating Increase fruits instead of drinking juice. Increase physical activity-walk 30 minutes 3 times perwek Stand every hour for a few minutes. MONITORING & EVALUATION Dietary intake, weekly physical activity, and weight 4 months.  Next Steps  Patient is to work on eating more foods from a garden and less processed and convenience foods.SABRA

## 2024-09-24 ENCOUNTER — Ambulatory Visit
Admission: EM | Admit: 2024-09-24 | Discharge: 2024-09-24 | Disposition: A | Discharge status: Home / Self Care | Source: Home / Self Care

## 2024-09-24 DIAGNOSIS — H60391 Other infective otitis externa, right ear: Secondary | ICD-10-CM | POA: Diagnosis not present

## 2024-09-24 DIAGNOSIS — H6501 Acute serous otitis media, right ear: Secondary | ICD-10-CM

## 2024-09-24 MED ORDER — CEFDINIR 300 MG PO CAPS: 300.0000 mg | ORAL_CAPSULE | Freq: Two times a day (BID) | ORAL | 0 refills | Status: DC

## 2024-09-24 MED ORDER — OFLOXACIN 0.3 % OT SOLN: 10.0000 [drp] | Freq: Every day | OTIC | 0 refills | Status: AC

## 2024-09-24 NOTE — ED Provider Notes (Signed)
 " [facility]    CSN: [id number] Arrival date & time: 09/24/24  1725      History   Chief Complaint No chief complaint on file.   HPI Bruce Nichols is a 39 y.o. male.   Bruce Nichols is a 39 y.o. male presenting for chief complaint of right ear pain that started approximately 2 weeks ago.  He was diagnosed with right otitis externa 1 week ago by PCP and has been otic drops for the last 1 week without relief of right ear pain.  Complains of decreased hearing to the right ear and popping sounds to the right ear.  He has not seen any drainage from the right ear and denies fever, chills, cough, congestion, and sore throat.     Past Medical History:  Diagnosis Date   ADD (attention deficit disorder)    Asthma    Scoliosis     Patient Active Problem List   Diagnosis Date Noted   Leukocytosis 08/27/2015   Asthma 08/27/2015    Past Surgical History:  Procedure Laterality Date   MOLE REMOVAL     right side of face       Home Medications    Prior to Admission medications  Medication Sig Start Date End Date Taking? Authorizing Provider  cefdinir  (OMNICEF ) 300 MG capsule Take 1 capsule (300 mg total) by mouth 2 (two) times daily for 7 days. 09/24/24 10/01/24 Yes Enedelia Dorna HERO, FNP  ofloxacin  (FLOXIN ) 0.3 % OTIC solution Place 10 drops into the right ear daily for 7 days. 09/24/24 10/01/24 Yes StanhopeDorna HERO, FNP  albuterol (PROVENTIL HFA;VENTOLIN HFA) 108 (90 Base) MCG/ACT inhaler Inhale 1 puff into the lungs every 6 (six) hours as needed for wheezing or shortness of breath.    [provider]  amphetamine-dextroamphetamine (ADDERALL) 20 MG tablet Take 20 mg by mouth daily.    [provider]  HYDROcodone -acetaminophen  (NORCO/VICODIN) 5-325 MG tablet Take one-two tabs po q 4-6 hrs prn pain 11/29/16   Herlinda Milling, PA-C    Family History History reviewed. No pertinent family history.  Social History Social History[1]   Allergies    Bee venom and Penicillins   Review of Systems Review of Systems Per HPI  Physical Exam Triage Vital Signs ED Triage Vitals  Encounter Vitals Group     BP 09/24/24 1759 120/85     Girls Systolic BP Percentile --      Girls Diastolic BP Percentile --      Boys Systolic BP Percentile --      Boys Diastolic BP Percentile --      Pulse Rate 09/24/24 1759 66     Resp 09/24/24 1759 20     Temp 09/24/24 1759 97.7 F (36.5 C)     Temp Source 09/24/24 1759 Oral     SpO2 09/24/24 1759 98 %     Weight --      Height --      Head Circumference --      Peak Flow --      Pain Score 09/24/24 1805 7     Pain Loc --      Pain Education --      Exclude from Growth Chart --    No data found.  Updated Vital Signs BP 120/85 (BP Location: Right Arm)   Pulse 66   Temp 97.7 F (36.5 C) (Oral)   Resp 20   SpO2 98%   Visual Acuity Right Eye Distance:  Left Eye Distance:   Bilateral Distance:    Right Eye Near:   Left Eye Near:    Bilateral Near:     Physical Exam Vitals and nursing note reviewed.  Constitutional:      Appearance: He is not ill-appearing or toxic-appearing.  HENT:     Head: Normocephalic and atraumatic.     Right Ear: Hearing and external ear normal. Drainage (Thick gooey white drainage to the right ear canal with decreased hearing and tenderness with manipulation of the pinna), swelling and tenderness present. There is impacted cerumen.     Left Ear: Hearing, tympanic membrane, ear canal and external ear normal.     Nose: Nose normal.     Mouth/Throat:     Lips: Pink.     Mouth: Mucous membranes are moist. No injury or oral lesions.     Dentition: Normal dentition.     Tongue: No lesions.     Pharynx: Oropharynx is clear. Uvula midline. No pharyngeal swelling, oropharyngeal exudate, posterior oropharyngeal erythema, uvula swelling or postnasal drip.     Tonsils: No tonsillar exudate.  Eyes:     General: Lids are normal. Vision grossly intact. Gaze aligned  appropriately.     Extraocular Movements: Extraocular movements intact.     Conjunctiva/sclera: Conjunctivae normal.  Neck:     Trachea: Trachea and phonation normal.  Pulmonary:     Effort: Pulmonary effort is normal.  Musculoskeletal:     Cervical back: Neck supple.  Lymphadenopathy:     Cervical: No cervical adenopathy.  Skin:    General: Skin is warm and dry.     Capillary Refill: Capillary refill takes less than 2 seconds.     Findings: No rash.  Neurological:     General: No focal deficit present.     Mental Status: He is alert and oriented to person, place, and time. Mental status is at baseline.     Cranial Nerves: No dysarthria or facial asymmetry.  Psychiatric:        Mood and Affect: Mood normal.        Speech: Speech normal.        Behavior: Behavior normal.        Thought Content: Thought content normal.        Judgment: Judgment normal.      UC Treatments / Results  Labs (all labs ordered are listed, but only abnormal results are displayed) Labs Reviewed - No data to display  EKG   Radiology No results found.  Procedures Procedures (including critical care time)  Medications Ordered in UC Medications - No data to display  Initial Impression / Assessment and Plan / UC Course  I have reviewed the triage vital signs and the nursing notes.  Pertinent labs & imaging results that were available during my care of the patient were reviewed by me and considered in my medical decision making (see chart for details).   1.  Nonrecurrent acute serous otitis media of right ear, infective otitis externa of right ear Presentation consistent with otitis externa.  Right ear lavage performed by nursing staff revealing bulging and erythematous right eardrum.  Right eardrum is intact and nonperforated.  Will manage this with ofloxacin  otic drops as prescribed for 7 days.   Cefdinir  antibiotic ordered to treat right otitis media.  History of penicillin allergic  reaction (rash), discussed low risk of rash with cephalosporin, he is agreeable with proceeding with cefdinir .  Encouraged to avoid getting water into affected ear(s) for  at least 7-10 days. Cotton ball to the external ear during showers to avoid water into the ear canal. Over the counter medications as needed for pain.    Counseled patient on potential for adverse effects with medications prescribed/recommended today, strict ER and return-to-clinic precautions discussed, patient verbalized understanding.    Final Clinical Impressions(s) / UC Diagnoses   Final diagnoses:  Non-recurrent acute serous otitis media of right ear  Infective otitis externa of right ear     Discharge Instructions      You have an ear infection of the ear canal known as otitis externa and otitis media (inner ear infection).  Take cefdinir  antibiotic every 12 hours for the next 7 days.  Use ear drops as prescribed for 7 days. Do not place anything smaller than elbow deep into ear canal- this includes Q-tips. Place a cotton ball into the external ear canal while in the shower to avoid getting water into the ears.  You may place a small amount of rubbing alcohol onto the end of a Q-tip and place this into the outer ear canal to dry up any remaining water that may have gotten into the ear while showering or submerging head underwater to prevent this type of infection in the future.       ED Prescriptions     Medication Sig Dispense Auth. Provider   ofloxacin  (FLOXIN ) 0.3 % OTIC solution Place 10 drops into the right ear daily for 7 days. 5 mL Enedelia Dorna HERO, FNP   cefdinir  (OMNICEF ) 300 MG capsule Take 1 capsule (300 mg total) by mouth 2 (two) times daily for 7 days. 14 capsule Enedelia Dorna HERO, FNP      PDMP not reviewed this encounter.    [1]  Social History Tobacco Use   Smoking status: Every Day    Current packs/day: 1.00    Types: Cigarettes   Smokeless tobacco: Never  Substance Use  Topics   Alcohol use: Yes    Comment: occasionally   Drug use: Yes    Types: Marijuana     Enedelia Dorna HERO, FNP 09/24/24 1910  "

## 2024-09-24 NOTE — ED Triage Notes (Signed)
 Pt reports pain in the right ear since December, has been seen in UC and primary, has been told they can't tell if its inner or outer, feels infection running into his ear. Has tried Ear drops has found no relief, can't hear when he bends his head down.

## 2024-09-24 NOTE — Discharge Instructions (Signed)
 You have an ear infection of the ear canal known as otitis externa and otitis media (inner ear infection).  Take cefdinir  antibiotic every 12 hours for the next 7 days.  Use ear drops as prescribed for 7 days. Do not place anything smaller than elbow deep into ear canal- this includes Q-tips. Place a cotton ball into the external ear canal while in the shower to avoid getting water into the ears.  You may place a small amount of rubbing alcohol onto the end of a Q-tip and place this into the outer ear canal to dry up any remaining water that may have gotten into the ear while showering or submerging head underwater to prevent this type of infection in the future.

## 2024-09-26 ENCOUNTER — Telehealth: Payer: Self-pay

## 2024-09-26 MED ORDER — CEFDINIR 300 MG PO CAPS: 300.0000 mg | ORAL_CAPSULE | Freq: Two times a day (BID) | ORAL | 0 refills | Status: AC
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
## 2025-01-20 ENCOUNTER — Encounter: Admitting: Nutrition
